# Patient Record
Sex: Female | Born: 1965 | Race: White | Hispanic: No | Marital: Married | Smoking: Never smoker
Health system: Southern US, Community
[De-identification: ages and names within clinical notes are randomized; demographics above are authoritative.]

## PROBLEM LIST (undated history)

## (undated) DIAGNOSIS — K219 Gastro-esophageal reflux disease without esophagitis: Secondary | ICD-10-CM

## (undated) DIAGNOSIS — Z87442 Personal history of urinary calculi: Secondary | ICD-10-CM

## (undated) DIAGNOSIS — Z9221 Personal history of antineoplastic chemotherapy: Secondary | ICD-10-CM

## (undated) DIAGNOSIS — Z923 Personal history of irradiation: Secondary | ICD-10-CM

## (undated) DIAGNOSIS — C801 Malignant (primary) neoplasm, unspecified: Secondary | ICD-10-CM

## (undated) HISTORY — PX: INSERTION CENTRAL VENOUS ACCESS DEVICE W/ SUBCUTANEOUS PORT: SUR725

## (undated) HISTORY — DX: Personal history of irradiation: Z92.3

---

## 2012-01-28 DIAGNOSIS — R7309 Other abnormal glucose: Secondary | ICD-10-CM | POA: Insufficient documentation

## 2012-01-30 DIAGNOSIS — Z0000 Encounter for general adult medical examination without abnormal findings: Secondary | ICD-10-CM | POA: Insufficient documentation

## 2012-01-30 DIAGNOSIS — E669 Obesity, unspecified: Secondary | ICD-10-CM | POA: Insufficient documentation

## 2016-01-23 DIAGNOSIS — Z9221 Personal history of antineoplastic chemotherapy: Secondary | ICD-10-CM

## 2016-01-23 HISTORY — DX: Personal history of antineoplastic chemotherapy: Z92.21

## 2016-03-22 DIAGNOSIS — C50919 Malignant neoplasm of unspecified site of unspecified female breast: Secondary | ICD-10-CM

## 2016-03-22 DIAGNOSIS — C801 Malignant (primary) neoplasm, unspecified: Secondary | ICD-10-CM

## 2016-03-22 HISTORY — DX: Malignant (primary) neoplasm, unspecified: C80.1

## 2016-03-22 HISTORY — DX: Malignant neoplasm of unspecified site of unspecified female breast: C50.919

## 2016-03-22 HISTORY — PX: BREAST BIOPSY: SHX20

## 2016-04-27 DIAGNOSIS — Z170 Estrogen receptor positive status [ER+]: Principal | ICD-10-CM

## 2016-04-27 DIAGNOSIS — C50211 Malignant neoplasm of upper-inner quadrant of right female breast: Secondary | ICD-10-CM | POA: Insufficient documentation

## 2016-05-31 DIAGNOSIS — Z95828 Presence of other vascular implants and grafts: Secondary | ICD-10-CM | POA: Insufficient documentation

## 2016-09-13 ENCOUNTER — Other Ambulatory Visit: Payer: BC Managed Care – PPO

## 2016-09-14 ENCOUNTER — Ambulatory Visit: Payer: BC Managed Care – PPO | Admitting: Hematology & Oncology

## 2016-09-18 ENCOUNTER — Ambulatory Visit (HOSPITAL_BASED_OUTPATIENT_CLINIC_OR_DEPARTMENT_OTHER): Payer: BC Managed Care – PPO

## 2016-09-18 ENCOUNTER — Ambulatory Visit (HOSPITAL_BASED_OUTPATIENT_CLINIC_OR_DEPARTMENT_OTHER): Payer: BC Managed Care – PPO | Admitting: Hematology & Oncology

## 2016-09-18 VITALS — BP 130/82 | HR 102 | Temp 98.7°F | Resp 17 | Wt 160.0 lb

## 2016-09-18 DIAGNOSIS — C50211 Malignant neoplasm of upper-inner quadrant of right female breast: Secondary | ICD-10-CM

## 2016-09-18 DIAGNOSIS — R911 Solitary pulmonary nodule: Secondary | ICD-10-CM | POA: Diagnosis not present

## 2016-09-18 DIAGNOSIS — Z452 Encounter for adjustment and management of vascular access device: Secondary | ICD-10-CM

## 2016-09-18 DIAGNOSIS — Z95828 Presence of other vascular implants and grafts: Secondary | ICD-10-CM

## 2016-09-18 DIAGNOSIS — Z808 Family history of malignant neoplasm of other organs or systems: Secondary | ICD-10-CM | POA: Diagnosis not present

## 2016-09-18 DIAGNOSIS — Z170 Estrogen receptor positive status [ER+]: Principal | ICD-10-CM

## 2016-09-18 DIAGNOSIS — Z803 Family history of malignant neoplasm of breast: Secondary | ICD-10-CM

## 2016-09-18 DIAGNOSIS — N951 Menopausal and female climacteric states: Secondary | ICD-10-CM

## 2016-09-18 DIAGNOSIS — F329 Major depressive disorder, single episode, unspecified: Secondary | ICD-10-CM | POA: Insufficient documentation

## 2016-09-18 DIAGNOSIS — C50911 Malignant neoplasm of unspecified site of right female breast: Secondary | ICD-10-CM | POA: Diagnosis not present

## 2016-09-18 DIAGNOSIS — F32A Depression, unspecified: Secondary | ICD-10-CM | POA: Insufficient documentation

## 2016-09-18 LAB — CBC WITH DIFFERENTIAL (CANCER CENTER ONLY)
BASO#: 0.1 10*3/uL (ref 0.0–0.2)
BASO%: 0.8 % (ref 0.0–2.0)
EOS ABS: 0.1 10*3/uL (ref 0.0–0.5)
EOS%: 1.5 % (ref 0.0–7.0)
HCT: 31.8 % — ABNORMAL LOW (ref 34.8–46.6)
HEMOGLOBIN: 9.9 g/dL — AB (ref 11.6–15.9)
LYMPH#: 1.1 10*3/uL (ref 0.9–3.3)
LYMPH%: 17.1 % (ref 14.0–48.0)
MCH: 28.6 pg (ref 26.0–34.0)
MCHC: 31.1 g/dL — ABNORMAL LOW (ref 32.0–36.0)
MCV: 92 fL (ref 81–101)
MONO#: 0.6 10*3/uL (ref 0.1–0.9)
MONO%: 9.2 % (ref 0.0–13.0)
NEUT%: 71.4 % (ref 39.6–80.0)
NEUTROS ABS: 4.7 10*3/uL (ref 1.5–6.5)
Platelets: 371 10*3/uL (ref 145–400)
RBC: 3.46 10*6/uL — AB (ref 3.70–5.32)
RDW: 15.7 % (ref 11.1–15.7)
WBC: 6.6 10*3/uL (ref 3.9–10.0)

## 2016-09-18 LAB — CMP (CANCER CENTER ONLY)
ALK PHOS: 52 U/L (ref 26–84)
ALT: 34 U/L (ref 10–47)
AST: 45 U/L — ABNORMAL HIGH (ref 11–38)
Albumin: 3.5 g/dL (ref 3.3–5.5)
BUN, Bld: 9 mg/dL (ref 7–22)
CALCIUM: 9.5 mg/dL (ref 8.0–10.3)
CO2: 26 mEq/L (ref 18–33)
Chloride: 106 mEq/L (ref 98–108)
Creat: 0.8 mg/dl (ref 0.6–1.2)
Glucose, Bld: 106 mg/dL (ref 73–118)
POTASSIUM: 4.3 meq/L (ref 3.3–4.7)
SODIUM: 137 meq/L (ref 128–145)
TOTAL PROTEIN: 6.7 g/dL (ref 6.4–8.1)
Total Bilirubin: 0.5 mg/dl (ref 0.20–1.60)

## 2016-09-18 LAB — LACTATE DEHYDROGENASE: LDH: 321 U/L — AB (ref 125–245)

## 2016-09-18 MED ORDER — ALTEPLASE 2 MG IJ SOLR
2.0000 mg | Freq: Once | INTRAMUSCULAR | Status: AC | PRN
  Administered 2016-09-18: 2 mg
  Filled 2016-09-18: qty 2

## 2016-09-18 MED ORDER — SODIUM CHLORIDE 0.9% FLUSH
10.0000 mL | INTRAVENOUS | Status: DC | PRN
  Administered 2016-09-18: 10 mL via INTRAVENOUS
  Filled 2016-09-18: qty 10

## 2016-09-18 MED ORDER — HEPARIN SOD (PORK) LOCK FLUSH 100 UNIT/ML IV SOLN
500.0000 [IU] | Freq: Once | INTRAVENOUS | Status: DC
  Filled 2016-09-18: qty 5

## 2016-09-18 MED ORDER — ALTEPLASE 2 MG IJ SOLR
INTRAMUSCULAR | Status: AC
  Filled 2016-09-18: qty 2

## 2016-09-18 MED ORDER — SODIUM CHLORIDE 0.9% FLUSH
10.0000 mL | INTRAVENOUS | Status: DC | PRN
  Filled 2016-09-18: qty 10

## 2016-09-18 MED ORDER — HEPARIN SOD (PORK) LOCK FLUSH 100 UNIT/ML IV SOLN
500.0000 [IU] | Freq: Once | INTRAVENOUS | Status: AC
  Administered 2016-09-18: 500 [IU] via INTRAVENOUS
  Filled 2016-09-18: qty 5

## 2016-09-18 MED ORDER — STERILE WATER FOR INJECTION IJ SOLN
INTRAMUSCULAR | Status: AC
  Filled 2016-09-18: qty 10

## 2016-09-18 NOTE — Progress Notes (Signed)
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 New Patient (Initial Visit)  : I just moved here.  HPI: Christina Joseph is a very charming 51 year old white female. She is perimenopausal. She presented to [place] back in April with an a larger mass in the right axilla. It was somewhat tender area and she had this evaluated. She had a sonogram done which showed a 5.4 cm mass.  A mammogram was done which showed a spiculated right breast mass.  She ultimately had a biopsy. This was of the right axillary lymph node. This showed an invasive ductal carcinoma consistent with breast primary. The tumor was ER positive/PR positive/HER-2 negative.  She underwent staging studies. 2 no surprise, she had a incredibly thorough evaluation by Dr. Joesph July. She underwent a CT scan. This showed a indeterminate 5 mm left lower lobe lung nodule. A bone scan was done which was negative. She had an MRI of the breast which confirmed a 3.7 x 2.7 cm mass in the medial right breast.  She had a Port-A-Cath placed. She was given neoadjuvant chemotherapy with AC-T.  she had 4 cycles of Adria/Cytoxan followed by 9 cycles of Taxol. She stopped the Taxol because of cutaneous toxicity.  Because of the fact that her husband had a drive a long distance to work, it was felt that it would be a lot easier if should they moved closer to his job. As such, she actually moved with her family just down the street from our cancer clinic.  She was kindly referred to [place] for continuation of her breast cancer therapy. She said that she had a very nice response to treatment. The axillary mass really decreased in size.  She had seen a Psychologist, sport and exercise. This was in [place]. She needs to see a surgeon closer to home. She had planned on a mastectomy.  She will clearly need radiation therapy.  She looks great. She feels that she  is postmenopausal. She is not had a monthly cycle since starting treatment.  She is having some hot flashes.  She does not smoke. 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  There is no history of diabetes. She has no high blood pressure.  She has one child who is 98 years old.  A maternal grandmother had breast cancer. Her mother passed away from [place] 15 years ago.  Overall, her performance status is ECOG 0.   No past medical history on file.:  No past surgical history on file.:   Current Outpatient Prescriptions:  .  dexamethasone (DECADRON) 4 MG tablet, Take 59m (2tabs) orally x 1 dose on chemo day 2. Take 855m(2tabs) orally twice daily on chemo days 3-4. Take with food., Disp: , Rfl:  .  HYDROcodone-acetaminophen (NORCO/VICODIN) 5-325 MG tablet, Take by mouth., Disp: , Rfl:  .  lidocaine-prilocaine (EMLA) cream, Apply topically to the port site 30-45 minutes before each port a cath access.  Cover with Press-N-Seal.  DO NOT rub into the skin., Disp: , Rfl:  .  Misc. Devices MISC, Cranial prosthesis, Disp: , Rfl:  .  ondansetron (ZOFRAN) 8 MG tablet, Take by mouth., Disp: , Rfl:  .  valACYclovir (VALTREX) 1000 MG tablet, Take by mouth., Disp: , Rfl:  .  CALCIUM PO, Take by mouth., Disp: , Rfl:  .  Cholecalciferol (VITAMIN D) 2000 units tablet, Take by mouth., Disp: , Rfl:  .  CYCLOPHOSPHAMIDE IV, Inject into the vein., Disp: , Rfl:  .  DOXOrubicin (ADRIAMYCIN) 10 MG SOLR, Inject into the vein., Disp: , Rfl:  .  paclitaxel (TAXOL) 30 MG/5ML injection, Inject into the vein., Disp: , Rfl:  .  pegfilgrastim (NEULASTA) 6 MG/0.6ML injection, Inject into the skin., Disp: , Rfl:  No current facility-administered medications for this visit.   Facility-Administered Medications Ordered in Other Visits:  .  heparin lock flush 100 unit/mL, 500 Units, Intravenous, Once, Ennever, Peter R, MD .  heparin lock flush 100 unit/mL, 500 Units, Intravenous, Once, [place], Sarah M, NP .   sodium chloride flush (NS) 0.9 % injection 10 mL, 10 mL, Intravenous, PRN, [name], MD .  sodium chloride flush (NS) 0.9 % injection 10 mL, 10 mL, Intravenous, PRN, [place], Holli Humbles, NP:  :  Not on File:  No family history on file.:  Social History   Social History  . Marital status: Married    Spouse name: N/A  . Number of children: N/A  . Years of education: N/A   Occupational History  . Not on file.   Social History Main Topics  . Smoking status: Not on file  . Smokeless tobacco: Not on file  . Alcohol use Not on file  . Drug use: Unknown  . Sexual activity: Not on file   Other Topics Concern  . Not on file   Social History Narrative  . No narrative on file  :  Pertinent items are noted in HPI.  Exam:Well-developed and well-nourished white female in no obvious distress. Vital signs show temperature of 98.7. Pulse 102. Blood pressure 130/82. Weight is 160 pounds. Head and neck exam shows no ocular or oral lesions. She has alopecia. There is no mucositis. She has no adenopathy in the neck. Thyroid is nonpalpable. Lungs are clear bilaterally. She has good air movement bilaterally. Cardiac exam regular rate and rhythm with no murmurs, rubs or bruits. Abdomen is soft. She has good bowel sounds. There is no fluid wave. There is no palpable liver or spleen tip. Breast exam was deferred. Her axillary show some fullness in the right axilla. There may be a residual palpable mass in the right axilla. It measures about maybe 1 cm.  Back exam shows no tenderness over the spine, ribs or hips. Extremities shows no clubbing, cyanosis or edema. Neurological exam shows no focal neurological deficits. Skin exam shows no rashes, ecchymoses or petechia.   Recent Labs  09/18/16 1058  WBC 6.6  HGB 9.9*  HCT 31.8*  PLT 371    Recent Labs  09/18/16 1058  NA 137  K 4.3  CL 106  CO2 26  GLUCOSE 106  BUN 9  CREATININE 0.8  CALCIUM 9.5    Blood smear review:   None  Pathology: See above     Assessment and Plan: Christina Joseph is a very charming 51 year old menopausal white female. She has a locally advanced ductal carcinoma of the right breast. She presented with a right axillary mass.  She really looks quite good. She has been through very aggressive chemotherapy. It sounds like she had dose dense chemotherapy with the A/C being given every 2 weeks. She's had a nice response by her physical exam.  We need to get her to surgery now. We'll make the referral to Dr. Dalbert Batman of general surgery. I think he would be a very good surgeon to help with her mastectomy.  The pathology at the time of her mastectomy will be  critical. I would have to believe that she has some residual disease that is active.  She clearly will need radiation therapy. We will make the referral to Dr. Sondra Come of radiation oncology.  As far as adjuvant therapy, she will be a great candidate for antiestrogen therapy. We need to make sure that she is menopausal. If she is not, she will clearly need chemical ovarian suppression.  I don't think that this left lower lobe lung nodule is malignant for her breast cancer. However, we have to follow up with this. I will see about another CT scan for follow-up.  I also would like to get an echocardiogram on her.  I also believe that the use of Prolia in the adjuvant setting would be helpful.  I spent about 60 minutes with her. She is very nice. She has been through quite a lot. She has had a great response to treatment. We just have to "finish the job" and do local therapy now with mastectomy and radiation.  I answered all of her questions. She is very thankful that we could see her because she lives so close to our office.  I would like to see her back in another 4 or 5 weeks. By then, she would've had her mastectomy. Her blood counts and immune system should be adequate for mastectomy.

## 2016-09-18 NOTE — Patient Instructions (Signed)
Ferumoxytol injection What is this medicine? FERUMOXYTOL is an iron complex. Iron is used to make healthy red blood cells, which carry oxygen and nutrients throughout the body. This medicine is used to treat iron deficiency anemia in people with chronic kidney disease. This medicine may be used for other purposes; ask your health care provider or pharmacist if you have questions. COMMON BRAND NAME(S): Feraheme What should I tell my health care provider before I take this medicine? They need to know if you have any of these conditions: -anemia not caused by low iron levels -high levels of iron in the blood -magnetic resonance imaging (MRI) test scheduled -an unusual or allergic reaction to iron, other medicines, foods, dyes, or preservatives -pregnant or trying to get pregnant -breast-feeding How should I use this medicine? This medicine is for injection into a vein. It is given by a health care professional in a hospital or clinic setting. Talk to your pediatrician regarding the use of this medicine in children. Special care may be needed. Overdosage: If you think you have taken too much of this medicine contact a poison control center or emergency room at once. NOTE: This medicine is only for you. Do not share this medicine with others. What if I miss a dose? It is important not to miss your dose. Call your doctor or health care professional if you are unable to keep an appointment. What may interact with this medicine? This medicine may interact with the following medications: -other iron products This list may not describe all possible interactions. Give your health care provider a list of all the medicines, herbs, non-prescription drugs, or dietary supplements you use. Also tell them if you smoke, drink alcohol, or use illegal drugs. Some items may interact with your medicine. What should I watch for while using this medicine? Visit your doctor or healthcare professional regularly. Tell  your doctor or healthcare professional if your symptoms do not start to get better or if they get worse. You may need blood work done while you are taking this medicine. You may need to follow a special diet. Talk to your doctor. Foods that contain iron include: whole grains/cereals, dried fruits, beans, or peas, leafy green vegetables, and organ meats (liver, kidney). What side effects may I notice from receiving this medicine? Side effects that you should report to your doctor or health care professional as soon as possible: -allergic reactions like skin rash, itching or hives, swelling of the face, lips, or tongue -breathing problems -changes in blood pressure -feeling faint or lightheaded, falls -fever or chills -flushing, sweating, or hot feelings -swelling of the ankles or feet Side effects that usually do not require medical attention (report to your doctor or health care professional if they continue or are bothersome): -diarrhea -headache -nausea, vomiting -stomach pain This list may not describe all possible side effects. Call your doctor for medical advice about side effects. You may report side effects to FDA at 1-800-FDA-1088. Where should I keep my medicine? This drug is given in a hospital or clinic and will not be stored at home. NOTE: This sheet is a summary. It may not cover all possible information. If you have questions about this medicine, talk to your doctor, pharmacist, or health care provider.  2018 Elsevier/Gold Standard (2015-02-10 12:41:49) Implanted [facility] Guide An implanted port is a type of central line that is placed under the skin. Central lines are used to provide IV access when treatment or nutrition needs to be given through  a person's veins. Implanted ports are used for long-term IV access. An implanted port may be placed because:  You need IV medicine that would be irritating to the small veins in your hands or arms.  You need long-term IV medicines,  such as antibiotics.  You need IV nutrition for a long period.  You need frequent blood draws for lab tests.  You need dialysis.  Implanted ports are usually placed in the chest area, but they can also be placed in the upper arm, the abdomen, or the leg. An implanted port has two main parts:  Reservoir. The reservoir is round and will appear as a small, raised area under your skin. The reservoir is the part where a needle is inserted to give medicines or draw blood.  Catheter. The catheter is a thin, flexible tube that extends from the reservoir. The catheter is placed into a large vein. Medicine that is inserted into the reservoir goes into the catheter and then into the vein.  How will I care for my incision site? Do not get the incision site wet. Bathe or shower as directed by your health care provider. How is my port accessed? Special steps must be taken to access the port:  Before the port is accessed, a numbing cream can be placed on the skin. This helps numb the skin over the port site.  Your health care provider uses a sterile technique to access the port. ? Your health care provider must put on a mask and sterile gloves. ? The skin over your port is cleaned carefully with an antiseptic and allowed to dry. ? The port is gently pinched between sterile gloves, and a needle is inserted into the port.  Only "non-coring" port needles should be used to access the port. Once the port is accessed, a blood return should be checked. This helps ensure that the port is in the vein and is not clogged.  If your port needs to remain accessed for a constant infusion, a clear (transparent) bandage will be placed over the needle site. The bandage and needle will need to be changed every week, or as directed by your health care provider.  Keep the bandage covering the needle clean and dry. Do not get it wet. Follow your health care provider's instructions on how to take a shower or bath while the  port is accessed.  If your port does not need to stay accessed, no bandage is needed over the port.  What is flushing? Flushing helps keep the port from getting clogged. Follow your health care provider's instructions on how and when to flush the port. Ports are usually flushed with saline solution or a medicine called heparin. The need for flushing will depend on how the port is used.  If the port is used for intermittent medicines or blood draws, the port will need to be flushed: ? After medicines have been given. ? After blood has been drawn. ? As part of routine maintenance.  If a constant infusion is running, the port may not need to be flushed.  How long will my port stay implanted? The port can stay in for as long as your health care provider thinks it is needed. When it is time for the port to come out, surgery will be done to remove it. The procedure is similar to the one performed when the port was put in. When should I seek immediate medical care? When you have an implanted port, you should seek immediate  medical care if:  You notice a bad smell coming from the incision site.  You have swelling, redness, or drainage at the incision site.  You have more swelling or pain at the port site or the surrounding area.  You have a fever that is not controlled with medicine.  This information is not intended to replace advice given to you by your health care provider. Make sure you discuss any questions you have with your health care provider. Document Released: 01/08/2005 Document Revised: 06/16/2015 Document Reviewed: 09/15/2012 Elsevier Interactive Patient Education  2017 [employer].

## 2016-09-19 ENCOUNTER — Encounter (HOSPITAL_BASED_OUTPATIENT_CLINIC_OR_DEPARTMENT_OTHER): Payer: Self-pay

## 2016-09-19 ENCOUNTER — Ambulatory Visit (HOSPITAL_BASED_OUTPATIENT_CLINIC_OR_DEPARTMENT_OTHER)
Admission: RE | Admit: 2016-09-19 | Discharge: 2016-09-19 | Disposition: A | Discharge status: Home / Self Care | Payer: BC Managed Care – PPO | Source: Ambulatory Visit | Attending: Hematology & Oncology | Admitting: Hematology & Oncology

## 2016-09-19 ENCOUNTER — Encounter: Payer: Self-pay | Admitting: Radiation Oncology

## 2016-09-19 ENCOUNTER — Other Ambulatory Visit: Payer: Self-pay | Admitting: Hematology & Oncology

## 2016-09-19 ENCOUNTER — Encounter: Payer: Self-pay | Admitting: *Deleted

## 2016-09-19 DIAGNOSIS — I2510 Atherosclerotic heart disease of native coronary artery without angina pectoris: Secondary | ICD-10-CM | POA: Insufficient documentation

## 2016-09-19 DIAGNOSIS — C50211 Malignant neoplasm of upper-inner quadrant of right female breast: Secondary | ICD-10-CM

## 2016-09-19 DIAGNOSIS — M899 Disorder of bone, unspecified: Secondary | ICD-10-CM | POA: Insufficient documentation

## 2016-09-19 DIAGNOSIS — Z170 Estrogen receptor positive status [ER+]: Secondary | ICD-10-CM

## 2016-09-19 DIAGNOSIS — R590 Localized enlarged lymph nodes: Secondary | ICD-10-CM | POA: Insufficient documentation

## 2016-09-19 DIAGNOSIS — D7389 Other diseases of spleen: Secondary | ICD-10-CM | POA: Insufficient documentation

## 2016-09-19 DIAGNOSIS — R911 Solitary pulmonary nodule: Secondary | ICD-10-CM | POA: Diagnosis not present

## 2016-09-19 HISTORY — DX: Malignant (primary) neoplasm, unspecified: C80.1

## 2016-09-19 LAB — CANCER ANTIGEN 27.29: CAN 27.29: 26.7 U/mL (ref 0.0–38.6)

## 2016-09-19 LAB — FOLLICLE STIMULATING HORMONE: FSH: 64.7 m[IU]/mL

## 2016-09-19 LAB — LUTEINIZING HORMONE: LH: 32.6 m[IU]/mL

## 2016-09-19 MED ORDER — IOPAMIDOL (ISOVUE-300) INJECTION 61%
100.0000 mL | Freq: Once | INTRAVENOUS | Status: AC | PRN
  Administered 2016-09-19: 80 mL via INTRAVENOUS

## 2016-09-19 NOTE — Progress Notes (Signed)
  Echocardiogram 2D Echocardiogram has been performed.  Rashmi Tallent L Androw 09/19/2016, 1:43 PM

## 2016-09-20 LAB — ESTRADIOL, ULTRA SENS: Estradiol, Sensitive: <2.5 pg/mL

## 2016-09-21 ENCOUNTER — Telehealth: Payer: Self-pay | Admitting: *Deleted

## 2016-09-21 NOTE — Telephone Encounter (Signed)
Called the patient to let her know that the lung nodule is still there but has not grown!!! I do not think it is cancerous!!!

## 2016-09-21 NOTE — Progress Notes (Signed)
Location of Breast Cancer: infiltrating ductal carcinoma of the right breast   Histology per Pathology Report:   04/09/16 Breast, right (medial), needle core biopsy:             Invasive ductal carcinoma, not otherwise specified.             See comment.   Diagnosis Comment Microscopic sections revealed evidence of a ductal carcinoma. Many of the tumor cells were dislodged or disassociated from the adjacent stroma. Some features of a carcinoma with papillary features were present. The pancytokeratin stain showed changes consistent with an invasive ductal carcinoma. The tumor cells were also strongly positive for E-cadherin. Immunoperoxidase stains for myosin and calponin failed to show any appreciable staining around tumor islands. Material will be submitted for the breast prognostic panel.      Receptor Status: ER(95%), PR (95%), Her2-neu (borderline), Ki-()  Did patient present with symptoms (if so, please note symptoms) or was this found on screening mammography?: enlarging mass in her right axillary area over the course of the last 2 months  Past/Anticipated interventions by surgeon, if any: breast biopsy 04/09/16  Past/Anticipated interventions by medical oncology, if any:  She was given neoadjuvant chemotherapy with AC-T.  she had 4 cycles of Adria/Cytoxan followed by 9 cycles of Taxol. She stopped the Taxol because of cutaneous toxicity.  She finished 08/30/16.  She had chemo at [facility] in [place].  Lymphedema issues, if any:  no   Pain issues, if any: no  SAFETY ISSUES:  Prior radiation? no  Pacemaker/ICD? no  Possible current pregnancy?no  Is the patient on methotrexate? no  Current Complaints / other details:  Patient has a family history of breath cancer in her paternal grandmother.  She recently moved from [facility].    BP 123/78 (BP Location: Left Arm, Patient Position: Sitting)   Pulse (!) 108   Temp 98.9 F (37.2 C) (Oral)   Ht _0  (1.626 m)   Wt 160  lb 3.2 oz (72.7 kg)   SpO2 98%   BMI 27.50 kg/m    Wt Readings from Last 3 Encounters:  09/26/16 160 lb 3.2 oz (72.7 kg)  09/18/16 160 lb (72.6 kg)      Hess, Craige Cotta, RN 09/21/2016,7:53 AM

## 2016-09-21 NOTE — Telephone Encounter (Signed)
-----   Message from [name], MD sent at 09/19/2016  4:59 PM EDT ----- Call - the lung nodule is still there but has not grown!!!  I do not think it is cancerous!!! Christina Joseph

## 2016-09-26 ENCOUNTER — Ambulatory Visit
Admission: RE | Admit: 2016-09-26 | Discharge: 2016-09-26 | Disposition: A | Discharge status: Home / Self Care | Payer: BC Managed Care – PPO | Source: Ambulatory Visit | Attending: Radiation Oncology | Admitting: Radiation Oncology

## 2016-09-26 ENCOUNTER — Encounter: Payer: Self-pay | Admitting: Radiation Oncology

## 2016-09-26 VITALS — BP 123/78 | HR 108 | Temp 98.9°F | Ht 64.0 in | Wt 160.2 lb

## 2016-09-26 DIAGNOSIS — C50911 Malignant neoplasm of unspecified site of right female breast: Secondary | ICD-10-CM | POA: Insufficient documentation

## 2016-09-26 DIAGNOSIS — C50211 Malignant neoplasm of upper-inner quadrant of right female breast: Secondary | ICD-10-CM

## 2016-09-26 DIAGNOSIS — Z170 Estrogen receptor positive status [ER+]: Principal | ICD-10-CM

## 2016-09-26 DIAGNOSIS — Z803 Family history of malignant neoplasm of breast: Secondary | ICD-10-CM | POA: Diagnosis not present

## 2016-09-26 DIAGNOSIS — Z510 Encounter for antineoplastic radiation therapy: Secondary | ICD-10-CM | POA: Insufficient documentation

## 2016-09-26 NOTE — Progress Notes (Signed)
Please see the Nurse Progress Note in the MD Initial Consult Encounter for this patient. 

## 2016-09-26 NOTE — Progress Notes (Signed)
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    HISTORY OF PRESENT ILLNESS::Christina Joseph is a 51 y.o. female who is kindly referred to our clinic by Dr. Marin Olp. The patient was seen by Dr. Marin Olp on 09/18/16. She previously received care in [facility] but recently moved to this area and transferred her care.  The patient originally presented to the [facility] in March 2018 with a large mass in the right axilla. Sonogram was done which showed a 5.4 cm mass. Mammogram showed a spiculated right breast mass. A biopsy of the right axillary lymph node was performed on 04/09/2016 which revealed an invasive ductal carcinoma consistent with breast primary. The tumor was ER 95%, PR 95%, HER-2(-).  She underwent staging studies. CT Chest/Abdomen scan showed an indeterminate 5 mm left lower lobe lung nodule. A bone scan was done which was negative. She had an MRI of the breast on 04/30/2016 which confirmed a 3.7 x 2.7 cm mass in the medial right breast with associated architectural distortion and a large heterogeneous right axillary mass with thick irregular rim enhancement measuring 6.9 x 6.5 cm.   She was given neoadjuvant chemotherapy with AC-T, with Dr. Joesph July at [facility] in [place]. She had 4 cycles of Adria/Cytoxan followed by 9 cycles of Taxol. She stopped the Taxol because of cutaneous toxicity.  Recent CT chest on 09/19/2016 shows a solitary bulky enlarged right axillary lymph node compatible with nodal metastasis, and no additional thoracic adenopathy. There is a solitary tiny solid 3 mm subpleural left lower lobe pulmonary  nodule, for which follow-up chest CT is advised in 3-6 months. There is a partially visualized mixed lytic and sclerotic lesion in the proximal left humeral metaphysis, incompletely evaluated on this scan. Findings may represent a bone infarct, although a chondoid lesion is not excluded. Appearance is not typical for breast cancer metastasis. Also seen is a subcentimeter low-attenuation central splenic lesion, too small to characterize. MRI abdomen with and without IV contrast is recommended in 3-6 months.  She has not yet met with a Education officer, environmental. She has been referred to meet with Dr. Dalbert Batman by Dr. Marin Olp. She is not interested in having a breast implant or reconstruction.   She did see a radiation oncologist while in [facility] but only saw her once and the physician was retiring the following week.   Family history of breast cancer includes a paternal grandmother who passed away in her 46s. There is no family history of ovarian cancer. Her mother was diagnosed with melanoma, died in her 39's from this problem.   PREVIOUS RADIATION THERAPY: No  PAST MEDICAL HISTORY:  has a past medical history of Cancer ([place]) (03/2016).    PAST SURGICAL HISTORY: Past Surgical History:  Procedure Laterality Date  . BREAST BIOPSY Right 03/2016  . INSERTION CENTRAL VENOUS ACCESS DEVICE W/ SUBCUTANEOUS PORT     done at [facility]    FAMILY HISTORY: family history includes Breast cancer in her paternal grandmother; Melanoma in her mother.  SOCIAL HISTORY:  reports that she has never smoked. She has never used smokeless tobacco. She reports that she does not drink alcohol or  use drugs.  ALLERGIES: Patient has no known allergies.  MEDICATIONS:  Current Outpatient Prescriptions  Medication Sig Dispense Refill  . Misc. Devices MISC Cranial prosthesis    . lidocaine-prilocaine (EMLA) cream Apply topically to the port site 30-45 minutes before each port a cath access.  Cover with Press-N-Seal.  DO NOT rub  into the skin.    Marland Kitchen ondansetron (ZOFRAN) 8 MG tablet Take by mouth.    . valACYclovir (VALTREX) 1000 MG tablet Take by mouth.     No current facility-administered medications for this encounter.     REVIEW OF SYSTEMS:  On review of systems, the patient reports that she is doing well overall. She denies any chest pain, shortness of breath, cough, fevers, chills, night sweats, unintended weight changes. She denies any bowel or bladder disturbances, and denies abdominal pain, nausea or vomiting. She denies any new musculoskeletal or joint aches or pains, new skin lesions or concerns. She denies lymphdema issues. She denies any pain. She denies breast pain or back pain. She denies headaches or blurry vision. She denies any nipple discharge or bleeding. She denies any major issues with chemotherapy. She denies any issues raising her arms above her head. She denies problems with swelling of the ankles or feet. She does note blisters on her feet after sitting on a woven rug. REVIEW OF SYSTEMS: A 10+ POINT REVIEW OF SYSTEMS WAS OBTAINED including neurology, dermatology, psychiatry, cardiac, respiratory, lymph, extremities, GI, GU, musculoskeletal, constitutional, reproductive, HEENT. All pertinent positives are noted in the HPI. All others are negative.   PHYSICAL EXAM:  height is '5\' 4"'$  (1.626 m) and weight is 160 lb 3.2 oz (72.7 kg). Her oral temperature is 98.9 F (37.2 C). Her blood pressure is 123/78 and her pulse is 108 (abnormal). Her oxygen saturation is 98%.   General: Alert and oriented, in no acute distress HEENT: Head is normocephalic. Extraocular movements are intact. Oropharynx is clear. Neck: Neck is supple, no palpable cervical or supraclavicular lymphadenopathy. Heart: Regular in rate and rhythm with no murmurs, rubs, or gallops. Chest: Clear to auscultation bilaterally, with no rhonchi, wheezes, or rales. Abdomen: Soft, nontender, nondistended, with no rigidity or guarding. Extremities: No  cyanosis or edema. Lymphatics: see Neck Exam Skin: No concerning lesions. Musculoskeletal: symmetric strength and muscle tone throughout. Neurologic: Cranial nerves II through XII are grossly intact. No obvious focalities. Speech is fluent. Coordination is intact. Psychiatric: Judgment and insight are intact. Affect is appropriate. Breast: Left breast no palpable mass or nipple discharge. Right breast no palpable mass or nipple discharge. In the right axilla, there is a barely palpable approximately 1.5 cm lymph node deep in the axillary region. No nipple discharge or bleeding.   ECOG = 1  0 - Asymptomatic (Fully active, able to carry on all predisease activities without restriction)  1 - Symptomatic but completely ambulatory (Restricted in physically strenuous activity but ambulatory and able to carry out work of a light or sedentary nature. For example, light housework, office work)  2 - Symptomatic, <50% in bed during the day (Ambulatory and capable of all self care but unable to carry out any work activities. Up and about more than 50% of waking hours)  3 - Symptomatic, >50% in bed, but not bedbound (Capable of only limited self-care, confined to bed or chair 50% or more of waking hours)  4 - Bedbound (Completely disabled. Cannot carry on any self-care. Totally confined to bed or chair)  5 - Death   Lolita Rieger, Daniel Nones  RH, Tormey DC, et al. [phone]). "Toxicity and response criteria of the [employer]". [place] Oncol. 5 (6): 649-55  LABORATORY DATA:  Lab Results  Component Value Date   WBC 6.6 09/18/2016   HGB 9.9 (L) 09/18/2016   HCT 31.8 (L) 09/18/2016   MCV 92 09/18/2016   PLT 371 09/18/2016   NEUTROABS 4.7 09/18/2016   Lab Results  Component Value Date   NA 137 09/18/2016   K 4.3 09/18/2016   CL 106 09/18/2016   CO2 26 09/18/2016   GLUCOSE 106 09/18/2016   CREATININE 0.8 09/18/2016   CALCIUM 9.5 09/18/2016      RADIOGRAPHY: Ct Chest W  Contrast  Result Date: 09/19/2016 CLINICAL DATA:  Stage III right breast cancer diagnosed March 2018, presenting for restaging status post neoadjuvant chemotherapy reportedly completed 1 day prior. Reported history of left lower lobe pulmonary nodule on pre-chemotherapy scan at an outside institution in April 2018. EXAM: CT CHEST WITH CONTRAST TECHNIQUE: Multidetector CT imaging of the chest was performed during intravenous contrast administration. CONTRAST:  75m ISOVUE-300 IOPAMIDOL (ISOVUE-300) INJECTION 61% COMPARISON:  None available at the time of this dictation. FINDINGS: Cardiovascular: Normal heart size. No significant pericardial fluid/thickening. Left anterior descending coronary atherosclerosis. Left subclavian MediPort terminates in the middle third of the superior vena cava. Thoracic aorta is normal in course and caliber. Normal caliber pulmonary arteries. No central pulmonary emboli. Mediastinum/Nodes: No discrete thyroid nodules. Unremarkable esophagus. Bulky 4.0 cm right axillary lymph node (series 2/ image 27) with hazy margins. No additional pathologically enlarged axillary, mediastinal or hilar lymph nodes. Lungs/Pleura: No pneumothorax. No pleural effusion. No acute consolidative airspace disease or lung masses. Anterior left lower lobe subpleural solid 3 mm pulmonary nodule (series 3/ image 82). No additional significant pulmonary nodules. Upper abdomen: Hypodense subcentimeter central splenic lesion (series 2/ image 132). Musculoskeletal: Partially visualized mixed lytic and sclerotic lesion in the proximal left humeral metaphysis with wide zone of transition. No additional focal osseous lesions. Mild thoracic spondylosis. IMPRESSION: 1. Solitary bulky enlarged right axillary lymph node compatible with nodal metastasis. No additional thoracic adenopathy. 2. Solitary tiny solid 3 mm subpleural left lower lobe pulmonary nodule, for which follow-up chest CT is advised in 3-6 months. 3.  Partially visualized mixed lytic and sclerotic lesion in the proximal left humeral metaphysis, incompletely evaluated on this scan. Findings may represent a bone infarct, although a chondroid lesion is not excluded. Appearance is not typical for breast cancer bone metastasis. Recommend dedicated left humerus radiographs and/or PET-CT for further evaluation. 4. Subcentimeter low-attenuation central splenic lesion, too small to characterize. Recommend attention on follow-up MRI abdomen without and with IV contrast in 3-6 months. 5. One vessel coronary atherosclerosis. Electronically Signed   By: JIlona SorrelM.D.   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  The patient may be a potential candidate for breast conservation given her excellent response to neoadjuvant treatment. She, however,  appears to be leaning toward mastectomy at this time. She will likely require an axillary dissection based on the number and size of  axillary lymph node involvement prior to neoadjuvant chemotherapy,  but will let Dr. IDalbert Batmanmake that determination. We discussed issues of breast reconstruction after mastectomy but at this point the patient does not wish to consider this as part of her treatment and does not wish to have  a referral to  plastic surgery.   She has already completed neoadjuvant chemotherapy. She will proceed with surgery followed by radiation therapy. We discussed this plan, as well as the risks, benefits, and side effects of radiotherapy. RT should decrease risk of locoregional recurrence by about 2/3, along with a modest overall survival benefit. We discussed that radiation would take approximately 6 1/2 weeks to complete. I would give the patient a few weeks to heal following surgery before starting treatment planning. We spoke about acute effects including skin irritation and  fatigue as well as much less common late effects including lung irritation. We spoke about the latest technology that is used to minimize the risk of late effects for breast cancer patients undergoing radiotherapy.     PLAN: The patient will meet with Dr. Fanny Skates soon and determine if she will proceed with breast conservation surgery or mastectomy. She will be referred back to our clinic after surgery for post-op radiation therapy.      ------------------------------------------------  Blair Promise, PhD, MD  This document serves as a record of services personally performed by Gery Pray, MD. It was created on his behalf by Arlyce Harman, a trained medical scribe. The creation of this record is based on the scribe's personal observations and the provider's statements to them. This document has been checked and approved by the attending provider.

## 2016-09-27 ENCOUNTER — Telehealth: Payer: Self-pay | Admitting: *Deleted

## 2016-09-27 NOTE — Telephone Encounter (Signed)
Patient to see Dr Dalbert Batman tomorrow at 2:15.  Patient aware

## 2016-10-02 ENCOUNTER — Other Ambulatory Visit: Payer: Self-pay | Admitting: General Surgery

## 2016-10-02 DIAGNOSIS — C50211 Malignant neoplasm of upper-inner quadrant of right female breast: Secondary | ICD-10-CM

## 2016-10-04 ENCOUNTER — Other Ambulatory Visit: Payer: Self-pay | Admitting: General Surgery

## 2016-10-04 DIAGNOSIS — Z170 Estrogen receptor positive status [ER+]: Principal | ICD-10-CM

## 2016-10-04 DIAGNOSIS — C50911 Malignant neoplasm of unspecified site of right female breast: Secondary | ICD-10-CM

## 2016-10-08 ENCOUNTER — Other Ambulatory Visit: Payer: Self-pay | Admitting: General Surgery

## 2016-10-08 DIAGNOSIS — Z170 Estrogen receptor positive status [ER+]: Principal | ICD-10-CM

## 2016-10-08 DIAGNOSIS — C50911 Malignant neoplasm of unspecified site of right female breast: Secondary | ICD-10-CM

## 2016-10-11 ENCOUNTER — Ambulatory Visit
Admission: RE | Admit: 2016-10-11 | Discharge: 2016-10-11 | Disposition: A | Discharge status: Home / Self Care | Payer: BC Managed Care – PPO | Source: Ambulatory Visit | Attending: General Surgery | Admitting: General Surgery

## 2016-10-11 DIAGNOSIS — C50211 Malignant neoplasm of upper-inner quadrant of right female breast: Secondary | ICD-10-CM

## 2016-10-11 MED ORDER — GADOBENATE DIMEGLUMINE 529 MG/ML IV SOLN: 15.0000 mL | Freq: Once | INTRAVENOUS | Status: DC | PRN

## 2016-10-11 NOTE — Pre-Procedure Instructions (Signed)
Christina Joseph  10/11/2016      [facility] - [place] - [name] [place] [address] Phone: [phone] Fax: [phone]    Your procedure is scheduled on September 26  Report to [place] at [place].M.  Call this number if you have problems the morning of surgery:  [phone]   Remember:  Do not eat food or drink liquids after midnight. Continue all other medications as directed by your physician except follow these medication instructions before surgery  Please complete your 8oz of Boost Breeze or Water that was given to you at your preadmission appointment by 0430am on the day of your surgery    Take these medicines the morning of surgery with A SIP OF WATER valACYclovir (VALTREX if needed  7 days prior to surgery STOP taking any Aspirin, Aleve, Naproxen, Ibuprofen, Motrin, Advil, Goody's, BC's, all herbal medications, fish oil, and all vitamins    Do not wear jewelry, make-up or nail polish.  Do not wear lotions, powders, or perfumes, or deoderant.  Do not shave 48 hours prior to surgery.  Men may shave face and neck.  Do not bring valuables to the hospital.  [facility] is not responsible for any belongings or valuables.  Contacts, dentures or bridgework may not be worn into surgery.  Leave your suitcase in the car.  After surgery it may be brought to your room.  For patients admitted to the hospital, discharge time will be determined by your treatment team.  Patients discharged the day of surgery will not be allowed to drive home.    Special instructions:   [place]- Preparing For Surgery  Before surgery, you can play an important role. Because skin is not sterile, your skin needs to be as free of germs as possible. You can reduce the number of germs on your skin by washing with CHG (chlorahexidine gluconate) Soap before surgery.  CHG is an  antiseptic cleaner which kills germs and bonds with the skin to continue killing germs even after washing.  Please do not use if you have an allergy to CHG or antibacterial soaps. If your skin becomes reddened/irritated stop using the CHG.  Do not shave (including legs and underarms) for at least 48 hours prior to first CHG shower. It is OK to shave your face.  Please follow these instructions carefully.   1. Shower the NIGHT BEFORE SURGERY and the MORNING OF SURGERY with CHG.   2. If you chose to wash your hair, wash your hair first as usual with your normal shampoo.  3. After you shampoo, rinse your hair and body thoroughly to remove the shampoo.  4. Use CHG as you would any other liquid soap. You can apply CHG directly to the skin and wash gently with a scrungie or a clean washcloth.   5. Apply the CHG Soap to your body ONLY FROM THE NECK DOWN.  Do not use on open wounds or open sores. Avoid contact with your eyes, ears, mouth and genitals (private parts). Wash genitals (private parts) with your normal soap.  6. Wash thoroughly, paying special attention to the area where your surgery will be performed.  7. Thoroughly rinse your body with warm water from the neck down.  8. DO NOT shower/wash with your normal soap after using and rinsing off the CHG Soap.  9. Pat yourself dry  with a CLEAN TOWEL.   10. Wear CLEAN PAJAMAS   11. Place CLEAN SHEETS on your bed the night of your first shower and DO NOT SLEEP WITH PETS.    Day of Surgery: Do not apply any deodorants/lotions. Please wear clean clothes to the hospital/surgery center.     Please read over the following fact sheets that you were given.

## 2016-10-12 ENCOUNTER — Encounter (HOSPITAL_COMMUNITY): Payer: Self-pay

## 2016-10-12 ENCOUNTER — Encounter (HOSPITAL_COMMUNITY)
Admission: RE | Admit: 2016-10-12 | Discharge: 2016-10-12 | Disposition: A | Discharge status: Home / Self Care | Payer: BC Managed Care – PPO | Source: Ambulatory Visit | Attending: General Surgery | Admitting: General Surgery

## 2016-10-12 DIAGNOSIS — Z01812 Encounter for preprocedural laboratory examination: Secondary | ICD-10-CM | POA: Insufficient documentation

## 2016-10-12 DIAGNOSIS — C50911 Malignant neoplasm of unspecified site of right female breast: Secondary | ICD-10-CM | POA: Diagnosis not present

## 2016-10-12 HISTORY — DX: Personal history of urinary calculi: Z87.442

## 2016-10-12 HISTORY — DX: Gastro-esophageal reflux disease without esophagitis: K21.9

## 2016-10-12 LAB — CBC WITH DIFFERENTIAL/PLATELET
BASOS ABS: 0 10*3/uL (ref 0.0–0.1)
Basophils Relative: 1 %
EOS ABS: 0.1 10*3/uL (ref 0.0–0.7)
EOS PCT: 3 %
HCT: 34.3 % — ABNORMAL LOW (ref 36.0–46.0)
Hemoglobin: 10.8 g/dL — ABNORMAL LOW (ref 12.0–15.0)
LYMPHS ABS: 1.4 10*3/uL (ref 0.7–4.0)
Lymphocytes Relative: 28 %
MCH: 27.6 pg (ref 26.0–34.0)
MCHC: 31.5 g/dL (ref 30.0–36.0)
MCV: 87.7 fL (ref 78.0–100.0)
Monocytes Absolute: 0.3 10*3/uL (ref 0.1–1.0)
Monocytes Relative: 6 %
Neutro Abs: 3 10*3/uL (ref 1.7–7.7)
Neutrophils Relative %: 62 %
PLATELETS: 231 10*3/uL (ref 150–400)
RBC: 3.91 MIL/uL (ref 3.87–5.11)
RDW: 15.6 % — ABNORMAL HIGH (ref 11.5–15.5)
WBC: 4.8 10*3/uL (ref 4.0–10.5)

## 2016-10-12 LAB — COMPREHENSIVE METABOLIC PANEL
ALT: 27 U/L (ref 14–54)
ANION GAP: 7 (ref 5–15)
AST: 30 U/L (ref 15–41)
Albumin: 3.9 g/dL (ref 3.5–5.0)
Alkaline Phosphatase: 44 U/L (ref 38–126)
BUN: <5 mg/dL — ABNORMAL LOW (ref 6–20)
CO2: 26 mmol/L (ref 22–32)
Calcium: 9.6 mg/dL (ref 8.9–10.3)
Chloride: 107 mmol/L (ref 101–111)
Creatinine, Ser: 0.66 mg/dL (ref 0.44–1.00)
Glucose, Bld: 106 mg/dL — ABNORMAL HIGH (ref 65–99)
POTASSIUM: 4 mmol/L (ref 3.5–5.1)
SODIUM: 140 mmol/L (ref 135–145)
Total Bilirubin: 0.6 mg/dL (ref 0.3–1.2)
Total Protein: 7.1 g/dL (ref 6.5–8.1)

## 2016-10-12 NOTE — Pre-Procedure Instructions (Signed)
Christina Joseph  10/12/2016      [facility] - [place], [place] - [address] [place] [address] Phone: [phone] Fax: [phone]    Your procedure is scheduled on September 26  Report to [place] at [place].M.  Call this number if you have problems the morning of surgery:  [phone]   Remember:  Do not eat food or drink liquids after midnight. Continue all other medications as directed by your physician except follow these medication instructions before surgery  Please complete your 8oz of Boost Breeze that was given to you at your preadmission appointment by 430am on the day of your surgery    Take these medicines the morning of surgery with A SIP OF WATER valACYclovir (VALTREX if needed  7 days prior to surgery STOP 10/12/16 taking any Aspirin, Aleve, Naproxen, Ibuprofen, Motrin, Advil, Goody's, BC's, all herbal medications, fish oil, and all vitamins    Do not wear jewelry, make-up or nail polish.  Do not wear lotions, powders, or perfumes, or deoderant.  Do not shave 48 hours prior to surgery.  Men may shave face and neck.  Do not bring valuables to the hospital.  [facility] is not responsible for any belongings or valuables.  Contacts, dentures or bridgework may not be worn into surgery.  Leave your suitcase in the car.  After surgery it may be brought to your room.  For patients admitted to the hospital, discharge time will be determined by your treatment team.  Patients discharged the day of surgery will not be allowed to drive home.    Special instructions:   [place]- Preparing For Surgery  Before surgery, you can play an important role. Because skin is not sterile, your skin needs to be as free of germs as possible. You can reduce the number of germs on your skin by washing with CHG (chlorahexidine gluconate) Soap before surgery.  CHG is an antiseptic  cleaner which kills germs and bonds with the skin to continue killing germs even after washing.  Please do not use if you have an allergy to CHG or antibacterial soaps. If your skin becomes reddened/irritated stop using the CHG.  Do not shave (including legs and underarms) for at least 48 hours prior to first CHG shower. It is OK to shave your face.  Please follow these instructions carefully.   1. Shower the NIGHT BEFORE SURGERY and the MORNING OF SURGERY with CHG.   2. If you chose to wash your hair, wash your hair first as usual with your normal shampoo.  3. After you shampoo, rinse your hair and body thoroughly to remove the shampoo.  4. Use CHG as you would any other liquid soap. You can apply CHG directly to the skin and wash gently with a scrungie or a clean washcloth.   5. Apply the CHG Soap to your body ONLY FROM THE NECK DOWN.  Do not use on open wounds or open sores. Avoid contact with your eyes, ears, mouth and genitals (private parts). Wash genitals (private parts) with your normal soap.  6. Wash thoroughly, paying special attention to the area where your surgery will be performed.  7. Thoroughly rinse your body with warm water from the neck down.  8. DO NOT shower/wash with your normal soap after using and rinsing off the CHG Soap.  9. Pat yourself dry with  a CLEAN TOWEL.   10. Wear CLEAN PAJAMAS   11. Place CLEAN SHEETS on your bed the night of your first shower and DO NOT SLEEP WITH PETS.    Day of Surgery: Do not apply any deodorants/lotions. Please wear clean clothes to the hospital/surgery center.     Please read over the following fact sheets that you were given.

## 2016-10-12 NOTE — Pre-Procedure Instructions (Signed)
Caridad W Gilvin  10/12/2016      [facility] - [place], [place] - [name] [place] [address] Phone: [phone] Fax: [phone]    Your procedure is scheduled on September 26  Report to [place] at [place].M.  Call this number if you have problems the morning of surgery:  [phone]   Remember:  Do not eat food or drink liquids after midnight. Continue all other medications as directed by your physician except follow these medication instructions before surgery  Please complete your 8oz of Boost Breeze that was given to you at your preadmission appointment by 430am on the day of your surgery    Take these medicines the morning of surgery with A SIP OF WATER valACYclovir (VALTREX if needed  7 days prior to surgery STOP 10/12/16 taking any Aspirin, Aleve, Naproxen, Ibuprofen, Motrin, Advil, Goody's, BC's, all herbal medications, fish oil, and all vitamins    Do not wear jewelry, make-up or nail polish.  Do not wear lotions, powders, or perfumes, or deoderant.  Do not shave 48 hours prior to surgery.  Men may shave face and neck.  Do not bring valuables to the hospital.  [facility] is not responsible for any belongings or valuables.  Contacts, dentures or bridgework may not be worn into surgery.  Leave your suitcase in the car.  After surgery it may be brought to your room.  For patients admitted to the hospital, discharge time will be determined by your treatment team.  Patients discharged the day of surgery will not be allowed to drive home.    Special instructions:   [place]- Preparing For Surgery  Before surgery, you can play an important role. Because skin is not sterile, your skin needs to be as free of germs as possible. You can reduce the number of germs on your skin by washing with CHG (chlorahexidine gluconate) Soap before surgery.  CHG is an antiseptic  cleaner which kills germs and bonds with the skin to continue killing germs even after washing.  Please do not use if you have an allergy to CHG or antibacterial soaps. If your skin becomes reddened/irritated stop using the CHG.  Do not shave (including legs and underarms) for at least 48 hours prior to first CHG shower. It is OK to shave your face.  Please follow these instructions carefully.   1. Shower the NIGHT BEFORE SURGERY and the MORNING OF SURGERY with CHG.   2. If you chose to wash your hair, wash your hair first as usual with your normal shampoo.  3. After you shampoo, rinse your hair and body thoroughly to remove the shampoo.  4. Use CHG as you would any other liquid soap. You can apply CHG directly to the skin and wash gently with a scrungie or a clean washcloth.   5. Apply the CHG Soap to your body ONLY FROM THE NECK DOWN.  Do not use on open wounds or open sores. Avoid contact with your eyes, ears, mouth and genitals (private parts). Wash genitals (private parts) with your normal soap.  6. Wash thoroughly, paying special attention to the area where your surgery will be performed.  7. Thoroughly rinse your body with warm water from the neck down.  8. DO NOT shower/wash with your normal soap after using and rinsing off the CHG Soap.  9. Pat yourself dry with  a CLEAN TOWEL.   10. Wear CLEAN PAJAMAS   11. Place CLEAN SHEETS on your bed the night of your first shower and DO NOT SLEEP WITH PETS.    Day of Surgery: Do not apply any deodorants/lotions. Please wear clean clothes to the hospital/surgery center.     Please read over the following fact sheets that you were given.

## 2016-10-13 NOTE — H&P (Signed)
Christina Joseph Location: [facility] Patient #: [id number] DOB: 07-08-1965 Married / Language: English / Race: White Female        History of Present Illness     . This is a 51 year old female who returns following neoadjuvant chemotherapy to plan definitive right breast surgery for her breast cancer. Unfortunately the MRI is not scheduled until September 20 Dr. Burney Gauze and Dr. Sondra Come ar involved in her caree Her husband is present throughout the encounter.      Recall that she is healthy but noticed a lump in her right axilla and went for evaluation. She had not had a mammogram in 7 years. Imaging studies and Rondall Allegra at [facility] included a mammogram which showed a spiculated right breast mass in the upper outer quadrant of the right breast and multiple axillary lymph nodes. By ultrasound the right axillary lymph node was 5.7 cm. 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      MRI on April 30, 2016 confirmed a 3.7 cm mass in the medial right breast with architectural distortion and also showed a large heterogeneous right axillary mass injuring 6.9 cm. Dr. Servando Snare placed a Port-A-Cath. 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 CT of chest on September 19, 2016 showed a solitary will be enlarged right axillary node. No intrathoracic adenopathy. Tiny 3 mm subpleural left lower lobe pulmonary nodule. Left humeral metaphysis this showed a sclerotic lesion incompletely evaluated. Thought not to be cancer.     She has transferred her care here because she lives in [place] her husband works in [employer]. 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 Dr. Marin Olp evaluated her and referred her to me.  When I saw her she was thinking she needed a mastectomy but she may be a candidate for breast conservation surgery and so she now thinks she wants to do that if the MRI is favorable. The MRI is pending.      Family history reveals maternal grandmother had breast cancer and died of metastatic disease. Mother died of melanoma. Father died of kidney disease and CHF. She lives in [place]. Married with 1 daughter. Husband works in [employer]. Former Proofreader. Denies alcohol or tobacco.   The plan is as follows: She wants to go ahead and schedule for right breast lumpectomy with radioactive seed localization, complete right axillary lymph node dissection. The Port-A-Cath will be left in MRI breast is scheduled on September 20 at [facility]  (It looks favorable) The breast surgery will be scheduled after September 24 but before October 3  I will call her and discuss the MRI results prior to the surgery      I discussed the indications, details, techniques, and numerous risk of the surgery with the patient and her husband. She is aware of the risk of bleeding, infection, reoperation for margins, cosmetic deformity, nerve damage with chronic pain, shoulder disability, arm swelling. She knows She knows that arm swelling is likely. She knows sensory nerve damage is likely. Postoperative physical therapy is advised. She understands all of these issues. All of her questions were answered. She agrees with this plan.   Allergies  No Known Drug Allergies   Medication History  No Current Medications  Vitals ( Weight: 160.6 lb Height: 64in Body Surface Area: 1.78 m Body Mass Index: 27.57 kg/m  Temp.: 98.80F  Pulse: 106 (Regular)  BP: 110/80 (Sitting, Left Arm, Standard)   Physical Exam General Mental Status-Alert. General Appearance-Not in acute distress. Build & Nutrition-Well nourished. Posture-Normal posture. Gait-Normal. Note: BMI 27   Head and  Neck Head-normocephalic, atraumatic with no lesions or palpable masses. Trachea-midline. Thyroid Gland Characteristics - normal size and consistency and no palpable nodules. Note: Complete alopecia   Chest and Lung Exam Chest and lung exam reveals -on auscultation, normal breath sounds, no adventitious sounds and normal vocal resonance. Note: Port-A-Cath palpable left infraclavicular area looks fine.   Breast Note: Medium slightly large. Vague thickening right breast but not obvious or discrete. Obvious palpable mass in the right axilla at least 3 cm in size but nontender. Slightly mobile. No arm swelling. Normal range of motion right shoulder. No other skin changes or masses in either breast. Left axilla negative. No supraclavicular adenopathy.   Cardiovascular Cardiovascular examination reveals -normal heart sounds, regular rate and rhythm with no murmurs and femoral artery auscultation bilaterally reveals normal pulses, no bruits, no thrills.  Abdomen Inspection Inspection of the abdomen reveals - No Hernias. Palpation/Percussion Palpation and Percussion of the abdomen reveal - Soft, Non Tender, No Rigidity (guarding), No hepatosplenomegaly and No Palpable abdominal masses.  Neurologic Neurologic evaluation reveals -alert and oriented x 3 with no impairment of recent or remote memory, normal attention span and ability to concentrate, normal sensation and normal coordination.  Musculoskeletal Normal Exam - Bilateral-Upper Extremity Strength Normal and Lower Extremity Strength Normal.    Assessment & Plan PRIMARY CANCER OF UPPER INNER QUADRANT OF RIGHT FEMALE BREAST (C50.211).        Your breast MRI is not scheduled until September 20 This will delay your surgery a little bit but will not have any effect on your outcome I have talked with Dr. Marin Olp and he wants to leave the port in  We talked a long time about logistics of planning the surgery as well as  your opinions regarding the extent of surgery you may well be a candidate for breast conservation. you state that if the MRI shows that you are a good candidate for breast conservation then you want to have a lumpectomy I think that is very reasonable and is actually probably likely you wanted to go ahead and make plans  you will be tentatively scheduled for right breast lumpectomy with radioactive seed localization and complete right axillary lymph node dissection We will schedule this after September 24 The radioactive seed will be placed at the breast center at [facility] 1-3 days preop The MRI (10/11/2016)  shows a good partial response to therapy. You are a good candidate for breast conservation. You still need a complete axillary node dissection.   PRIMARY MALIGNANT NEOPLASM OF RIGHT BREAST WITH METASTASIS TO MOVABLE IPSILATERAL LEVEL 1 OR 2 AXILLARY LYMPH NODES (N1) (C50.911)    Sten Dematteo M. Dalbert Batman, M.D., [facility] General and Minimally invasive Surgery Breast and Colorectal Surgery Office:   [phone] Pager:   [phone]

## 2016-10-16 ENCOUNTER — Encounter (HOSPITAL_COMMUNITY): Payer: Self-pay | Admitting: Anesthesiology

## 2016-10-16 ENCOUNTER — Ambulatory Visit
Admission: RE | Admit: 2016-10-16 | Discharge: 2016-10-16 | Disposition: A | Discharge status: Home / Self Care | Payer: BC Managed Care – PPO | Source: Ambulatory Visit | Attending: General Surgery | Admitting: General Surgery

## 2016-10-16 DIAGNOSIS — C50911 Malignant neoplasm of unspecified site of right female breast: Secondary | ICD-10-CM

## 2016-10-16 DIAGNOSIS — Z170 Estrogen receptor positive status [ER+]: Principal | ICD-10-CM

## 2016-10-16 NOTE — Anesthesia Preprocedure Evaluation (Addendum)
Anesthesia Evaluation  Patient identified by MRN, date of birth, ID band Patient awake    Reviewed: Allergy & Precautions, NPO status , Patient's Chart, lab work & pertinent test results  Airway Mallampati: II  TM Distance: >3 FB Neck ROM: Full    Dental  (+) Dental Advisory Given   Pulmonary neg pulmonary ROS,    breath sounds clear to auscultation       Cardiovascular negative cardio ROS   Rhythm:Regular Rate:Normal  08/2016: Left ventricle: The cavity size was normal. Wall thickness was normal. Systolic function was normal. The estimated ejection fraction was in the range of 60% to 65%. Wall motion was normal  there were no regional wall motion abnormalities. Doppler parameters are consistent with abnormal left ventricular relaxation (grade 1 diastolic dysfunction). GLS:19.4%   Neuro/Psych Depression negative neurological ROS     GI/Hepatic Neg liver ROS, GERD  ,  Endo/Other  Breast CA  Renal/GU negative Renal ROS     Musculoskeletal   Abdominal   Peds  Hematology  (+) anemia ,   Anesthesia Other Findings   Reproductive/Obstetrics                            Lab Results  Component Value Date   WBC 4.8 10/12/2016   HGB 10.8 (L) 10/12/2016   HCT 34.3 (L) 10/12/2016   MCV 87.7 10/12/2016   PLT 231 10/12/2016   Lab Results  Component Value Date   CREATININE 0.66 10/12/2016   BUN <5 (L) 10/12/2016   NA 140 10/12/2016   K 4.0 10/12/2016   CL 107 10/12/2016   CO2 26 10/12/2016    Anesthesia Physical Anesthesia Plan  ASA: II  Anesthesia Plan: General   Post-op Pain Management:  Regional for Post-op pain   Induction: Intravenous  PONV Risk Score and Plan: 3 and Ondansetron, Dexamethasone, Midazolam and Treatment may vary due to age or medical condition  Airway Management Planned: LMA  Additional Equipment:   Intra-op Plan:   Post-operative Plan: Extubation in  OR  Informed Consent: I have reviewed the patients History and Physical, chart, labs and discussed the procedure including the risks, benefits and alternatives for the proposed anesthesia with the patient or authorized representative who has indicated his/her understanding and acceptance.   Dental advisory given  Plan Discussed with: CRNA  Anesthesia Plan Comments:        Anesthesia Quick Evaluation

## 2016-10-17 ENCOUNTER — Ambulatory Visit (HOSPITAL_COMMUNITY): Payer: BC Managed Care – PPO | Admitting: Anesthesiology

## 2016-10-17 ENCOUNTER — Encounter (HOSPITAL_COMMUNITY): Payer: Self-pay | Admitting: *Deleted

## 2016-10-17 ENCOUNTER — Ambulatory Visit (HOSPITAL_COMMUNITY)
Admission: RE | Admit: 2016-10-17 | Discharge: 2016-10-18 | Disposition: A | Discharge status: Home / Self Care | Payer: BC Managed Care – PPO | Source: Ambulatory Visit | Attending: General Surgery | Admitting: General Surgery

## 2016-10-17 ENCOUNTER — Encounter (HOSPITAL_COMMUNITY)
Admission: RE | Disposition: A | Discharge status: Home / Self Care | Payer: Self-pay | Source: Ambulatory Visit | Attending: General Surgery

## 2016-10-17 ENCOUNTER — Ambulatory Visit
Admission: RE | Admit: 2016-10-17 | Discharge: 2016-10-17 | Disposition: A | Discharge status: Home / Self Care | Payer: BC Managed Care – PPO | Source: Ambulatory Visit | Attending: General Surgery | Admitting: General Surgery

## 2016-10-17 DIAGNOSIS — Z8249 Family history of ischemic heart disease and other diseases of the circulatory system: Secondary | ICD-10-CM | POA: Insufficient documentation

## 2016-10-17 DIAGNOSIS — C50111 Malignant neoplasm of central portion of right female breast: Secondary | ICD-10-CM | POA: Diagnosis not present

## 2016-10-17 DIAGNOSIS — C50211 Malignant neoplasm of upper-inner quadrant of right female breast: Secondary | ICD-10-CM

## 2016-10-17 DIAGNOSIS — Z9221 Personal history of antineoplastic chemotherapy: Secondary | ICD-10-CM | POA: Diagnosis not present

## 2016-10-17 DIAGNOSIS — Z803 Family history of malignant neoplasm of breast: Secondary | ICD-10-CM | POA: Insufficient documentation

## 2016-10-17 DIAGNOSIS — Z170 Estrogen receptor positive status [ER+]: Principal | ICD-10-CM

## 2016-10-17 DIAGNOSIS — C773 Secondary and unspecified malignant neoplasm of axilla and upper limb lymph nodes: Secondary | ICD-10-CM | POA: Diagnosis not present

## 2016-10-17 DIAGNOSIS — C50911 Malignant neoplasm of unspecified site of right female breast: Secondary | ICD-10-CM

## 2016-10-17 HISTORY — PX: BREAST LUMPECTOMY WITH RADIOACTIVE SEED AND AXILLARY LYMPH NODE DISSECTION: SHX6656

## 2016-10-17 HISTORY — PX: AXILLARY NODE DISSECTION: SHX1211

## 2016-10-17 HISTORY — PX: BREAST LUMPECTOMY: SHX2

## 2016-10-17 LAB — HCG, SERUM, QUALITATIVE: Preg, Serum: NEGATIVE

## 2016-10-17 SURGERY — BREAST LUMPECTOMY WITH RADIOACTIVE SEED AND AXILLARY LYMPH NODE DISSECTION
Anesthesia: General | Site: Breast | Laterality: Right

## 2016-10-17 MED ORDER — FENTANYL CITRATE (PF) 100 MCG/2ML IJ SOLN
INTRAMUSCULAR | Status: AC
  Filled 2016-10-17: qty 2

## 2016-10-17 MED ORDER — HYDROCODONE-ACETAMINOPHEN 5-325 MG PO TABS
1.0000 | ORAL_TABLET | ORAL | Status: DC | PRN
  Administered 2016-10-17 (×2): 2 via ORAL
  Administered 2016-10-17: 1 via ORAL
  Filled 2016-10-17 (×2): qty 2

## 2016-10-17 MED ORDER — HYDROMORPHONE HCL 1 MG/ML IJ SOLN
INTRAMUSCULAR | Status: AC
  Administered 2016-10-17: 0.5 mg via INTRAVENOUS
  Filled 2016-10-17: qty 1

## 2016-10-17 MED ORDER — ONDANSETRON HCL 4 MG/2ML IJ SOLN
INTRAMUSCULAR | Status: DC | PRN
  Administered 2016-10-17: 4 mg via INTRAVENOUS

## 2016-10-17 MED ORDER — POTASSIUM CHLORIDE 2 MEQ/ML IV SOLN
INTRAVENOUS | Status: DC
  Administered 2016-10-17 – 2016-10-18 (×2): via INTRAVENOUS
  Filled 2016-10-17 (×5): qty 1000

## 2016-10-17 MED ORDER — METHYLENE BLUE 0.5 % INJ SOLN
INTRAVENOUS | Status: AC
Start: 2016-10-17 — End: 2016-10-17
  Filled 2016-10-17: qty 10

## 2016-10-17 MED ORDER — METHOCARBAMOL 500 MG PO TABS
ORAL_TABLET | ORAL | Status: AC
  Administered 2016-10-17: 500 mg via ORAL
  Filled 2016-10-17: qty 1

## 2016-10-17 MED ORDER — DEXAMETHASONE SODIUM PHOSPHATE 10 MG/ML IJ SOLN
INTRAMUSCULAR | Status: DC | PRN
  Administered 2016-10-17: 10 mg via INTRAVENOUS

## 2016-10-17 MED ORDER — BUPIVACAINE-EPINEPHRINE (PF) 0.5% -1:200000 IJ SOLN
INTRAMUSCULAR | Status: DC | PRN
  Administered 2016-10-17: 30 mL

## 2016-10-17 MED ORDER — HYDROMORPHONE HCL 1 MG/ML IJ SOLN
0.2500 mg | INTRAMUSCULAR | Status: DC | PRN
  Administered 2016-10-17 (×2): 0.5 mg via INTRAVENOUS

## 2016-10-17 MED ORDER — FENTANYL CITRATE (PF) 250 MCG/5ML IJ SOLN
INTRAMUSCULAR | Status: AC
  Filled 2016-10-17: qty 5

## 2016-10-17 MED ORDER — BUPIVACAINE-EPINEPHRINE (PF) 0.25% -1:200000 IJ SOLN
INTRAMUSCULAR | Status: AC
  Filled 2016-10-17: qty 30

## 2016-10-17 MED ORDER — LACTATED RINGERS IV SOLN
INTRAVENOUS | Status: DC
  Administered 2016-10-17 (×2): via INTRAVENOUS

## 2016-10-17 MED ORDER — CEFAZOLIN SODIUM-DEXTROSE 2-4 GM/100ML-% IV SOLN
2.0000 g | INTRAVENOUS | Status: AC
  Administered 2016-10-17: 2 g via INTRAVENOUS
  Filled 2016-10-17: qty 100

## 2016-10-17 MED ORDER — NEOSTIGMINE METHYLSULFATE 5 MG/5ML IV SOSY
PREFILLED_SYRINGE | INTRAVENOUS | Status: AC
  Filled 2016-10-17: qty 5

## 2016-10-17 MED ORDER — ENOXAPARIN SODIUM 40 MG/0.4ML ~~LOC~~ SOLN: 40.0000 mg | SUBCUTANEOUS | Status: DC

## 2016-10-17 MED ORDER — PROPOFOL 10 MG/ML IV BOLUS
INTRAVENOUS | Status: AC
  Filled 2016-10-17: qty 20

## 2016-10-17 MED ORDER — BUPIVACAINE-EPINEPHRINE 0.25% -1:200000 IJ SOLN
INTRAMUSCULAR | Status: DC | PRN
  Administered 2016-10-17: 15 mL

## 2016-10-17 MED ORDER — ONDANSETRON HCL 4 MG/2ML IJ SOLN
INTRAMUSCULAR | Status: AC
  Filled 2016-10-17: qty 2

## 2016-10-17 MED ORDER — SODIUM CHLORIDE 0.9% FLUSH: 10.0000 mL | INTRAVENOUS | Status: DC | PRN

## 2016-10-17 MED ORDER — SENNA 8.6 MG PO TABS
1.0000 | ORAL_TABLET | Freq: Two times a day (BID) | ORAL | Status: DC
  Administered 2016-10-17: 8.6 mg via ORAL
  Filled 2016-10-17: qty 1

## 2016-10-17 MED ORDER — PHENYLEPHRINE 40 MCG/ML (10ML) SYRINGE FOR IV PUSH (FOR BLOOD PRESSURE SUPPORT)
PREFILLED_SYRINGE | INTRAVENOUS | Status: AC
  Filled 2016-10-17: qty 10

## 2016-10-17 MED ORDER — DEXAMETHASONE SODIUM PHOSPHATE 10 MG/ML IJ SOLN
INTRAMUSCULAR | Status: AC
  Filled 2016-10-17: qty 1

## 2016-10-17 MED ORDER — TRAMADOL HCL 50 MG PO TABS: 50.0000 mg | ORAL_TABLET | Freq: Four times a day (QID) | ORAL | Status: DC | PRN

## 2016-10-17 MED ORDER — PROPOFOL 10 MG/ML IV BOLUS
INTRAVENOUS | Status: DC | PRN
  Administered 2016-10-17: 200 mg via INTRAVENOUS

## 2016-10-17 MED ORDER — SODIUM CHLORIDE 0.9 % IJ SOLN
INTRAMUSCULAR | Status: AC
  Filled 2016-10-17: qty 10

## 2016-10-17 MED ORDER — MIDAZOLAM HCL 2 MG/2ML IJ SOLN
INTRAMUSCULAR | Status: AC
  Filled 2016-10-17: qty 2

## 2016-10-17 MED ORDER — SUCCINYLCHOLINE CHLORIDE 200 MG/10ML IV SOSY
PREFILLED_SYRINGE | INTRAVENOUS | Status: AC
  Filled 2016-10-17: qty 10

## 2016-10-17 MED ORDER — PROMETHAZINE HCL 25 MG/ML IJ SOLN: 6.2500 mg | INTRAMUSCULAR | Status: DC | PRN

## 2016-10-17 MED ORDER — CHLORHEXIDINE GLUCONATE CLOTH 2 % EX PADS: 6.0000 | MEDICATED_PAD | Freq: Once | CUTANEOUS | Status: DC

## 2016-10-17 MED ORDER — ONDANSETRON HCL 4 MG/2ML IJ SOLN: 4.0000 mg | Freq: Four times a day (QID) | INTRAMUSCULAR | Status: DC | PRN

## 2016-10-17 MED ORDER — ONDANSETRON 4 MG PO TBDP: 4.0000 mg | ORAL_TABLET | Freq: Four times a day (QID) | ORAL | Status: DC | PRN

## 2016-10-17 MED ORDER — LIDOCAINE 2% (20 MG/ML) 5 ML SYRINGE
INTRAMUSCULAR | Status: DC | PRN
  Administered 2016-10-17: 20 mg via INTRAVENOUS

## 2016-10-17 MED ORDER — CELECOXIB 200 MG PO CAPS
200.0000 mg | ORAL_CAPSULE | ORAL | Status: AC
  Administered 2016-10-17: 200 mg via ORAL
  Filled 2016-10-17: qty 1

## 2016-10-17 MED ORDER — MIDAZOLAM HCL 5 MG/5ML IJ SOLN
INTRAMUSCULAR | Status: DC | PRN
  Administered 2016-10-17: 2 mg via INTRAVENOUS

## 2016-10-17 MED ORDER — ACETAMINOPHEN 500 MG PO TABS
1000.0000 mg | ORAL_TABLET | ORAL | Status: AC
  Administered 2016-10-17: 1000 mg via ORAL
  Filled 2016-10-17: qty 2

## 2016-10-17 MED ORDER — FENTANYL CITRATE (PF) 100 MCG/2ML IJ SOLN
100.0000 ug | Freq: Once | INTRAMUSCULAR | Status: AC
  Administered 2016-10-17: 100 ug via INTRAVENOUS

## 2016-10-17 MED ORDER — GABAPENTIN 300 MG PO CAPS
300.0000 mg | ORAL_CAPSULE | ORAL | Status: AC
  Administered 2016-10-17: 300 mg via ORAL
  Filled 2016-10-17: qty 1

## 2016-10-17 MED ORDER — 0.9 % SODIUM CHLORIDE (POUR BTL) OPTIME
TOPICAL | Status: DC | PRN
  Administered 2016-10-17 (×2): 1000 mL

## 2016-10-17 MED ORDER — EPHEDRINE SULFATE 50 MG/ML IJ SOLN
INTRAMUSCULAR | Status: AC
  Filled 2016-10-17: qty 1

## 2016-10-17 MED ORDER — HYDROMORPHONE HCL 1 MG/ML IJ SOLN: 1.0000 mg | INTRAMUSCULAR | Status: DC | PRN

## 2016-10-17 MED ORDER — LIDOCAINE 2% (20 MG/ML) 5 ML SYRINGE
INTRAMUSCULAR | Status: AC
  Filled 2016-10-17: qty 10

## 2016-10-17 MED ORDER — MIDAZOLAM HCL 2 MG/2ML IJ SOLN
2.0000 mg | Freq: Once | INTRAMUSCULAR | Status: AC
  Administered 2016-10-17: 2 mg via INTRAVENOUS

## 2016-10-17 MED ORDER — ROCURONIUM BROMIDE 10 MG/ML (PF) SYRINGE
PREFILLED_SYRINGE | INTRAVENOUS | Status: AC
  Filled 2016-10-17: qty 5

## 2016-10-17 MED ORDER — FENTANYL CITRATE (PF) 100 MCG/2ML IJ SOLN
INTRAMUSCULAR | Status: DC | PRN
  Administered 2016-10-17 (×2): 25 ug via INTRAVENOUS
  Administered 2016-10-17: 50 ug via INTRAVENOUS
  Administered 2016-10-17: 25 ug via INTRAVENOUS

## 2016-10-17 MED ORDER — VALACYCLOVIR HCL 500 MG PO TABS: 1000.0000 mg | ORAL_TABLET | Freq: Every day | ORAL | Status: DC

## 2016-10-17 MED ORDER — METHOCARBAMOL 500 MG PO TABS
500.0000 mg | ORAL_TABLET | Freq: Four times a day (QID) | ORAL | Status: DC | PRN
  Administered 2016-10-17: 500 mg via ORAL

## 2016-10-17 MED ORDER — HYDROCODONE-ACETAMINOPHEN 5-325 MG PO TABS
ORAL_TABLET | ORAL | Status: AC
  Administered 2016-10-17: 1 via ORAL
  Filled 2016-10-17: qty 1

## 2016-10-17 SURGICAL SUPPLY — 49 items
APPLIER CLIP 9.375 MED OPEN (MISCELLANEOUS) ×6
BINDER BREAST LRG (GAUZE/BANDAGES/DRESSINGS) IMPLANT
BINDER BREAST XLRG (GAUZE/BANDAGES/DRESSINGS) ×3 IMPLANT
BLADE CLIPPER SURG (BLADE) IMPLANT
CANISTER SUCT 3000ML PPV (MISCELLANEOUS) ×3 IMPLANT
CHLORAPREP W/TINT 26ML (MISCELLANEOUS) ×3 IMPLANT
CLIP APPLIE 9.375 MED OPEN (MISCELLANEOUS) ×2 IMPLANT
CONT SPEC 4OZ CLIKSEAL STRL BL (MISCELLANEOUS) ×3 IMPLANT
COVER PROBE W GEL 5X96 (DRAPES) ×3 IMPLANT
COVER SURGICAL LIGHT HANDLE (MISCELLANEOUS) ×3 IMPLANT
DERMABOND ADVANCED (GAUZE/BANDAGES/DRESSINGS) ×2
DERMABOND ADVANCED .7 DNX12 (GAUZE/BANDAGES/DRESSINGS) ×1 IMPLANT
DEVICE DUBIN SPECIMEN MAMMOGRA (MISCELLANEOUS) ×3 IMPLANT
DRAIN CHANNEL 19F RND (DRAIN) ×3 IMPLANT
DRAPE LAPAROSCOPIC ABDOMINAL (DRAPES) ×3 IMPLANT
DRAPE UTILITY XL STRL (DRAPES) ×6 IMPLANT
DRSG PAD ABDOMINAL 8X10 ST (GAUZE/BANDAGES/DRESSINGS) ×3 IMPLANT
ELECT CAUTERY BLADE 6.4 (BLADE) ×3 IMPLANT
ELECT REM PT RETURN 9FT ADLT (ELECTROSURGICAL) ×3
ELECTRODE REM PT RTRN 9FT ADLT (ELECTROSURGICAL) ×1 IMPLANT
EVACUATOR SILICONE 100CC (DRAIN) ×3 IMPLANT
GAUZE SPONGE 4X4 12PLY STRL (GAUZE/BANDAGES/DRESSINGS) ×6 IMPLANT
GLOVE EUDERMIC 7 POWDERFREE (GLOVE) ×3 IMPLANT
GOWN STRL REUS W/ TWL LRG LVL3 (GOWN DISPOSABLE) ×1 IMPLANT
GOWN STRL REUS W/ TWL XL LVL3 (GOWN DISPOSABLE) ×1 IMPLANT
GOWN STRL REUS W/TWL LRG LVL3 (GOWN DISPOSABLE) ×2
GOWN STRL REUS W/TWL XL LVL3 (GOWN DISPOSABLE) ×2
ILLUMINATOR WAVEGUIDE N/F (MISCELLANEOUS) IMPLANT
KIT BASIN OR (CUSTOM PROCEDURE TRAY) ×3 IMPLANT
KIT MARKER MARGIN INK (KITS) ×3 IMPLANT
KIT ROOM TURNOVER OR (KITS) ×3 IMPLANT
LIGHT WAVEGUIDE WIDE FLAT (MISCELLANEOUS) IMPLANT
NEEDLE 18GX1X1/2 (RX/OR ONLY) (NEEDLE) ×3 IMPLANT
NEEDLE FILTER BLUNT 18X 1/2SAF (NEEDLE)
NEEDLE FILTER BLUNT 18X1 1/2 (NEEDLE) IMPLANT
NEEDLE HYPO 25GX1X1/2 BEV (NEEDLE) ×6 IMPLANT
NS IRRIG 1000ML POUR BTL (IV SOLUTION) ×3 IMPLANT
PACK GENERAL/GYN (CUSTOM PROCEDURE TRAY) ×3 IMPLANT
PAD ABD 8X10 STRL (GAUZE/BANDAGES/DRESSINGS) ×3 IMPLANT
PAD ARMBOARD 7.5X6 YLW CONV (MISCELLANEOUS) ×3 IMPLANT
SPONGE LAP 4X18 X RAY DECT (DISPOSABLE) ×9 IMPLANT
SUT ETHILON 2 0 FS 18 (SUTURE) ×3 IMPLANT
SUT MNCRL AB 4-0 PS2 18 (SUTURE) ×6 IMPLANT
SUT SILK 2 0 SH (SUTURE) ×3 IMPLANT
SUT VIC AB 3-0 SH 18 (SUTURE) ×9 IMPLANT
SUT VICRYL AB 3 0 TIES (SUTURE) IMPLANT
SYR CONTROL 10ML LL (SYRINGE) ×6 IMPLANT
TOWEL OR 17X24 6PK STRL BLUE (TOWEL DISPOSABLE) ×3 IMPLANT
TOWEL OR 17X26 10 PK STRL BLUE (TOWEL DISPOSABLE) ×3 IMPLANT

## 2016-10-17 NOTE — Anesthesia Postprocedure Evaluation (Signed)
Anesthesia Post Note  Patient: Christina Joseph  Procedure(s) Performed: Procedure(s) (LRB): RIGHT BREAST LUMPECTOMY WITH RADIOACTIVE SEED AND RIGHT COMPLETE AXILLARY LYMPH NODE DISSECTION ERAS PATHWAY (Right)     Patient location during evaluation: PACU Anesthesia Type: General Level of consciousness: awake and alert Pain management: pain level controlled Vital Signs Assessment: post-procedure vital signs reviewed and stable Respiratory status: spontaneous breathing, nonlabored ventilation, respiratory function stable and patient connected to nasal cannula oxygen Cardiovascular status: blood pressure returned to baseline and stable Postop Assessment: no apparent nausea or vomiting Anesthetic complications: no    Last Vitals:  Vitals:   10/17/16 1150 10/17/16 1205  BP: 129/81   Pulse: 72   Resp: 10   Temp:  36.6 C  SpO2: 99%     Last Pain:  Vitals:   10/17/16 1205  TempSrc:   PainSc: 3                  Tiajuana Amass

## 2016-10-17 NOTE — Anesthesia Procedure Notes (Signed)
Procedure Name: LMA Insertion Date/Time: 10/17/2016 8:30 AM Performed by: Merrilyn Puma B Pre-anesthesia Checklist: Patient identified, Emergency Drugs available, Suction available, Patient being monitored and Timeout performed Patient Re-evaluated:Patient Re-evaluated prior to induction Oxygen Delivery Method: Circle system utilized Preoxygenation: Pre-oxygenation with 100% oxygen Induction Type: IV induction Ventilation: Mask ventilation without difficulty LMA: LMA inserted LMA Size: 4.0 Number of attempts: 1 Placement Confirmation: positive ETCO2 and breath sounds checked- equal and bilateral Dental Injury: Teeth and Oropharynx as per pre-operative assessment

## 2016-10-17 NOTE — Op Note (Signed)
Patient Name:           Christina Joseph   Date of Surgery:        10/17/2016  Pre op Diagnosis:      Invasive cancer right breast, central, receptor positive with ipsilateral lymph node metastasis                                       Status post neoadjuvant chemotherapy  Post op Diagnosis:    Same  Procedure:                 Right breast lumpectomy with radioactive seed localization                                      Complete right axillary lymph node dissection  Surgeon:                     Edsel Petrin. Dalbert Batman, M.D., FACS  Assistant:                      OR staff   Indication for Assistant: N/A  Operative Indications:   This is a 51 year old female who returns following neoadjuvant chemotherapy to plan definitive right breast surgery for her breast cancer.  Dr. Burney Gauze and Dr. Sondra Come ar involved in her care.       she is healthy but noticed a lump in her right axilla and went for evaluation. She had not had a mammogram in 7 years. Imaging studies and Rondall Allegra at [facility] included a mammogram which showed a spiculated right breast mass in the deep medial quadrant of the right breast and multiple axillary lymph nodes. By ultrasound the right axillary lymph node was 5.7 cm. 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      MRI on April 30, 2016 confirmed a 3.7 cm mass in the medial right breast with architectural distortion and also showed a large heterogeneous right axillary mass injuring 6.9 cm. Dr. Servando Snare placed a Port-A-Cath. 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 CT of chest on September 19, 2016 showed a solitary will be enlarged right axillary node. No intrathoracic adenopathy. Tiny 3 mm subpleural left  lower lobe pulmonary nodule. Left humeral metaphysis this showed a sclerotic lesion incompletely evaluated. Thought not to be cancer.     She has transferred her care here because she lives in [place] her husband works in [place]. Dr. Marin Olp evaluated her and referred her to me. When I saw her she was thinking she needed a mastectomy but she may be a candidate for breast conservation surgery and so she now thinks she wants to do.  Her into treatment MRI showed decrease in the size of the axillary lymph nodes and significant decrease in the size of the breast tumor.  She is a candidate for lumpectomy.      Family history reveals maternal grandmother had breast cancer and died of metastatic disease. Mother died of melanoma. Father died of kidney disease and CHF.n. Dr. Rosiland Oz request  that The Port-A-Cath will be left in.      I discussed the indications, details, techniques, and numerous risk of the surgery with the patient and her husband.  She agrees with this plan.  Operative Findings:       The original titanium marker clip and the radioactive seed were very close to one another in the central breast medially at the 3:00 position.  Because of the original size of the tumor, I did a generous lumpectomy, at least 7 cm in diameter and the specimen mammogram looked very good with the marker clip and seed in the center of the specimen.  There were matted palpable right axillary lymph nodes.  I did a complete level I and level II right axillary dissection.  Procedure in Detail:          Following the induction of general LMA anesthesia the patient's right breast and chest wall and axilla were prepped and draped in a sterile fashion.  Surgical timeout was performed.  Intravenous antibiotics were given.  0.5% Marcaine with epinephrine was used as local infiltration anesthetic to  supplement the pectoral block that was performed by the anesthesiologist.     Using the neoprobe I identified the area  of the radioactive seed.  I made a transverse radial incision in the right breast at the 3:00 position.  The lumpectomy was performed with electrocautery and the neoprobe.  The specimen was removed and marked with silk sutures and a 6 color ink kit to orient the pathologist.  Specimen mammogram looked very good as described above.  Specimen was marked and sent to the lab where the seed was retrieved.  The lumpectomy incision was irrigated.  Hemostasis was excellent.  I placed 5 metal marker clips in the walls of the lumpectomy cavity.  The breast tissues were closed in 2 separate layers with interrupted 3-0 Vicryl and the skin closed with a running subcutaneous 4-0 Monocryl and Dermabond.     A transverse incision was made in the right axilla at the hairline.  Dissection was carried down through subcutaneous tissue.  The clavipectoral fascia was incised and  the axilla entered.  Tissues were somewhat thickened and leathery presumably due to chemotherapy effect and tumor.  I slowly dissected the axillary contents away from the pectoralis major and minor muscles and retracted this medially.  I carried the dissection  up to the axillary vein.  I took down the superficial tributaries of the axillary vein and controlled these with metal clips and divided them.  I took out all the axillary contents below the axillary vein preserving the thoracodorsal neurovascular bundle and the long thoracic nerve.  This adenopathy was somewhat bulky but was not fixed to the chest wall.  The axillary  contents were sent to pathology as a separate specimen.  The axillary space was copiously irrigated.  We spent a moderate amount of time making sure that it was completely hemostatic.  19 French Blake drain was placed up and the axillae space and brought out through a separate stab incision inferolaterally and sutured to the skin and connected to a suction bulb.  The clavipectoral fascia was closed with 3-0 Vicryl sutures and the skin  closed with a running subcuticular suture   of 4-0 Monocryl and Dermabond.  Dry bandages and a breast binder were placed.  The patient tolerated the procedure well was taken to PACU in stable condition.  EBL 30 mL.  Counts correct.  Complications none.       Haywood M. Ingram, M.D., FACS General and Minimally Invasive Surgery Breast and Colorectal Surgery  10/17/2016 10:19 AM  

## 2016-10-17 NOTE — Interval H&P Note (Signed)
History and Physical Interval Note:  10/17/2016 7:32 AM  Crimson W Tersigni  has presented today for surgery, with the diagnosis of cancer right breast  The various methods of treatment have been discussed with the patient and family. After consideration of risks, benefits and other options for treatment, the patient has consented to  Procedure(s) with comments: RIGHT BREAST LUMPECTOMY WITH RADIOACTIVE SEED AND RIGHT COMPLETE AXILLARY LYMPH NODE DISSECTION ERAS PATHWAY (Right) - PECTORAL BLOCK as a surgical intervention .  The patient's history has been reviewed, patient examined, no change in status, stable for surgery.  I have reviewed the patient's chart and labs.  Questions were answered to the patient's satisfaction.     Adin Hector

## 2016-10-17 NOTE — Transfer of Care (Signed)
Immediate Anesthesia Transfer of Care Note  Patient: Christina Joseph  Procedure(s) Performed: Procedure(s) with comments: RIGHT BREAST LUMPECTOMY WITH RADIOACTIVE SEED AND RIGHT COMPLETE AXILLARY LYMPH NODE DISSECTION ERAS PATHWAY (Right) - PECTORAL BLOCK  Patient Location: PACU  Anesthesia Type:General  Level of Consciousness: sedated  Airway & Oxygen Therapy: Patient Spontanous Breathing and Patient connected to nasal cannula oxygen  Post-op Assessment: Report given to RN and Post -op Vital signs reviewed and stable  Post vital signs: Reviewed and stable  Last Vitals:  Vitals:   10/17/16 0755 10/17/16 0800  BP: (!) 148/85 (!) 154/78  Pulse: 93 88  Resp: (!) 30 20  Temp:    SpO2: 99% 100%    Last Pain:  Vitals:   10/17/16 0646  TempSrc: Oral      Patients Stated Pain Goal: 2 (65/03/54 6568)  Complications: No apparent anesthesia complications

## 2016-10-17 NOTE — Anesthesia Procedure Notes (Signed)
Anesthesia Regional Block: Pectoralis block   Pre-Anesthetic Checklist: ,, timeout performed, Correct Patient, Correct Site, Correct Laterality, Correct Procedure, Correct Position, site marked, Risks and benefits discussed,  Surgical consent,  Pre-op evaluation,  At surgeon's request and post-op pain management  Laterality: Right  Prep: chloraprep       Needles:  Injection technique: Single-shot  Needle Type: Echogenic Needle     Needle Length: 9cm  Needle Gauge: 21     Additional Needles:   Procedures:,,,, ultrasound used (permanent image in chart),,,,  Narrative:  Start time: 10/17/2016 7:45 AM End time: 10/17/2016 7:52 AM Injection made incrementally with aspirations every 5 mL.  Performed by: Personally  Anesthesiologist: Suzette Battiest

## 2016-10-17 NOTE — Progress Notes (Signed)
Patient arrived to 6n25, alert and oriented, mild pain to incision site. VSS, IV fluids running, SCD's on. Patient noted to have 2 incisions on right breast with skin glue, gauze, ABD pad, and breast binder. Under medial incision patient has a JP drain. Family in waiting room, oriented patient to room and staff, will continue to monitor.

## 2016-10-18 ENCOUNTER — Encounter (HOSPITAL_COMMUNITY): Payer: Self-pay | Admitting: General Surgery

## 2016-10-18 DIAGNOSIS — C50111 Malignant neoplasm of central portion of right female breast: Secondary | ICD-10-CM | POA: Diagnosis not present

## 2016-10-18 LAB — CBC
HCT: 29 % — ABNORMAL LOW (ref 36.0–46.0)
Hemoglobin: 9 g/dL — ABNORMAL LOW (ref 12.0–15.0)
MCH: 26.8 pg (ref 26.0–34.0)
MCHC: 31 g/dL (ref 30.0–36.0)
MCV: 86.3 fL (ref 78.0–100.0)
Platelets: 212 10*3/uL (ref 150–400)
RBC: 3.36 MIL/uL — ABNORMAL LOW (ref 3.87–5.11)
RDW: 15 % (ref 11.5–15.5)
WBC: 8.3 10*3/uL (ref 4.0–10.5)

## 2016-10-18 LAB — CREATININE, SERUM
CREATININE: 0.56 mg/dL (ref 0.44–1.00)
GFR calc Af Amer: >60 mL/min (ref >60)

## 2016-10-18 MED ORDER — HEPARIN SOD (PORK) LOCK FLUSH 100 UNIT/ML IV SOLN
500.0000 [IU] | INTRAVENOUS | Status: AC | PRN
  Administered 2016-10-18: 500 [IU]

## 2016-10-18 MED ORDER — HYDROCODONE-ACETAMINOPHEN 5-325 MG PO TABS: 1.0000 | ORAL_TABLET | ORAL | 0 refills | Status: DC | PRN

## 2016-10-18 NOTE — Discharge Summary (Signed)
Patient ID: Christina Joseph [id number] 51 y.o. [birth year]  Admit date: 10/17/2016  Discharge date and time: 10/18/2016  Admitting Physician: Adin Hector  Discharge Physician: Adin Hector  Admission Diagnoses: cancer right breast  Discharge Diagnoses: invasive cancer right breast, central, receptor positive, with ipsilateral lymph node metastasis                                         Status post neoadjuvant chemotherapy  Operations: Procedure(s): RIGHT BREAST LUMPECTOMY WITH RADIOACTIVE SEED AND RIGHT COMPLETE AXILLARY LYMPH NODE DISSECTION ERAS PATHWAY  Admission Condition: good  Discharged Condition: good  Indication for Admission: This is a 51 year old female who returns following neoadjuvant chemotherapy to plan definitive right breast surgery for her breast cancer.  Dr. Burney Gauze and Dr. Sondra Come ar involved in her care.  she is healthy but noticed a lump in her right axilla and went for evaluation. She had not had a mammogram in 7 years. Imaging studies and Rondall Allegra at [facility] included a mammogram which showed a spiculated right breast mass in the deep medial quadrant of the right breast and multiple axillary lymph nodes. By ultrasound the right axillary lymph node was 5.7 cm. 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 MRI on April 30, 2016 confirmed a 3.7 cm mass in the medial right breast with architectural distortion and also showed a large heterogeneous right axillary mass injuring 6.9 cm. Dr. Servando Snare placed a Port-A-Cath. 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 CT of chest on September 19, 2016 showed a solitary will be enlarged right axillary node. No intrathoracic adenopathy.  Tiny 3 mm subpleural left lower lobe pulmonary nodule. Left humeral metaphysis this showed a sclerotic lesion incompletely evaluated. Thought not to be cancer. She has transferred her care here because she lives in [place] her husband works in [place]. Dr. Marin Olp evaluated her and referred her to me. When I saw her she was thinking she needed a mastectomy but she may be a candidate for breast conservation surgery and so she now thinks she wants to do.  Her into treatment MRI showed decrease in the size of the axillary lymph nodes and significant decrease in the size of the breast tumor.  She is a candidate for lumpectomy. Family history reveals maternal grandmother had breast cancer and died of metastatic disease. Mother died of melanoma. Father died of kidney disease and CHF.n. Dr. Rosiland Oz request that The Port-A-Cath will be left in. I discussed the indications, details, techniques, and numerous risk of the surgery with the patient and her husband.  She agrees with this plan.  Hospital Course: on the day of admission the patient was taken to the operating room and underwent a right breast lumpectomy with radioactive seed localization and complete right axillary lymph node dissection.  Operative findings were the original titanium marker clip and the radioactive seed were very close to one another in the central breast medially at the 3:00 position.  Because of the original size of the tumor, I did a generous lumpectomy, at least 7 cm in diameter and the specimen mammogram looked very good with the marker clip and seed  in the center of the specimen.  There were matted palpable right axillary lymph nodes.  I did a complete level I and level II right axillary dissection.    The patient was admitted overnight to observe for bleeding and pain control.  She did very well.  On postop day 1 she was ambulatory, voiding, pain was well controlled.  She wanted to go home.  The lumpectomy  incision in the axillary incision were clean dry soft and flat without hematoma.  The drainage was serosanguineous and of relatively low volume.  She was advised in diet and activities and shoulder range of motion.  She was given a prescription for Norco for pain.  She was educated regarding drain management.  I will see her back in the office in one week  Consults: None  Significant Diagnostic Studies: surgical pathology, pending  Treatments: surgery,right breast lumpectomy and right axillary lymph node dissection  Disposition: Home  Patient Instructions:  Allergies as of 10/18/2016   No Known Allergies     Medication List    TAKE these medications   HYDROcodone-acetaminophen 5-325 MG tablet Commonly known as:  NORCO/VICODIN Take 1-2 tablets by mouth every 4 (four) hours as needed for moderate pain.   valACYclovir 1000 MG tablet Commonly known as:  VALTREX Take 1,000 mg by mouth daily as needed.            Discharge Care Instructions        Start     Ordered   10/18/16 0000  HYDROcodone-acetaminophen (NORCO/VICODIN) 5-325 MG tablet  Every 4 hours PRN     10/18/16 0538   10/18/16 0000  Increase activity slowly     10/18/16 0538   10/18/16 0000  Diet - low sodium heart healthy     10/18/16 0538   10/18/16 0000  Discharge instructions    Comments:  [facility] LAPAROSCOPIC SURGERY: POST OP INSTRUCTIONS Always review your discharge instruction sheet given to you by the facility where your surgery was performed. IF YOU HAVE DISABILITY OR FAMILY LEAVE FORMS, YOU MUST BRING THEM TO THE OFFICE FOR PROCESSING.   DO NOT GIVE THEM TO YOUR DOCTOR.  A prescription for pain medication may be given to you upon discharge.  Take your pain medication as prescribed, if needed.  If narcotic pain medicine is not needed, then you may take acetaminophen (Tylenol) or ibuprofen (Advil) as needed. Take your usually prescribed medications unless otherwise  directed. If you need a refill on your pain medication, please contact your pharmacy.  They will contact our office to request authorization. Prescriptions will not be filled after 5pm or on week-ends. You should follow a light diet the first few days after arrival home, such as soup and crackers, etc.  Be sure to include lots of fluids daily. Most patients will experience some swelling and bruising in the area of the incisions.  Ice packs will help.  Swelling and bruising can take several days to resolve.  It is common to experience some constipation if taking pain medication after surgery.  Increasing fluid intake and taking a stool softener (such as Colace) will usually help or prevent this problem from occurring.  A mild laxative (Milk of Magnesia or Miralax) should be taken according to package instructions if there are no bowel movements after 48 hours. Unless discharge instructions indicate otherwise, you may remove your bandages 24-48 hours after surgery, and you may shower at that time.  You may have steri-strips (small skin  tapes) in place directly over the incision.  These strips should be left on the skin for 7-10 days.  If your surgeon used skin glue on the incision, you may shower in 24 hours.  The glue will flake off over the next 2-3 weeks.  Any sutures or staples will be removed at the office during your follow-up visit. ACTIVITIES:  You may resume regular (light) daily activities beginning the next day-such as daily self-care, walking, climbing stairs-gradually increasing activities as tolerated.  You may have sexual intercourse when it is comfortable.  Refrain from any heavy lifting or straining until approved by your doctor. You may drive when you are no longer taking prescription pain medication, you can comfortably wear a seatbelt, and you can safely maneuver your car and apply brakes. RETURN TO WORK:  __________________________________________________________ Dennis Bast should see your  doctor in the office for a follow-up appointment approximately 2-3 weeks after your surgery.  Make sure that you call for this appointment within a day or two after you arrive home to insure a convenient appointment time. OTHER INSTRUCTIONS: __________________________________________________________________________________________________________________________ __________________________________________________________________________________________________________________________ WHEN TO CALL YOUR DOCTOR: Fever over 101.0 Inability to urinate Continued bleeding from incision. Increased pain, redness, or drainage from the incision. Increasing abdominal pain  The clinic staff is available to answer your questions during regular business hours.  Please don't hesitate to call and ask to speak to one of the nurses for clinical concerns.  If you have a medical emergency, go to the nearest emergency room or call 911.  A surgeon from [facility] is always on call at the hospital. [address] [place], [place], [place]  [address] ? [address], [place], [place]   [address] [phone] ? [phone] ? FAX [phone] Web site: [email or web address]   10/18/16 0538   10/18/16 0000  May shower / [place]     10/18/16 0538   10/18/16 0000  May walk up steps     10/18/16 0538   10/18/16 0000  Driving Restrictions    Comments:  2-4 days   10/18/16 0538   10/18/16 0000  Lifting restrictions    Comments:  20 lbs   10/18/16 0538   10/18/16 0000  No wound care     10/18/16 0538   10/18/16 0000  Call MD for:  persistant dizziness or light-headedness     10/18/16 0538   10/18/16 0000  Call MD for:  hives     10/18/16 0538   10/18/16 0000  Call MD for:  difficulty breathing, headache or visual disturbances     10/18/16 0538   10/18/16 0000  Call MD for:  redness, tenderness, or signs of infection (pain, swelling, redness, odor or green/yellow discharge around incision  site)     10/18/16 0538   10/18/16 0000  Call MD for:  severe uncontrolled pain     10/18/16 0538   10/18/16 0000  Call MD for:  persistant nausea and vomiting     10/18/16 0538   10/18/16 0000  Call MD for:  temperature >100.4     10/18/16 0538      Activity: unlimited ambulation.  May shower starting Saturday.  Move your shoulder around as instructed keep a written record of the drainage Diet: low fat, low cholesterol diet Wound Care: as directed  Follow-up:  With Dr. Dalbert Batman in 1 week.  Signed: Edsel Petrin. Dalbert Batman, M.D., FACS General and minimally invasive surgery Breast and Colorectal Surgery   Addendum: I logged onto the  [facility] website and reviewed her prescription medication history  10/18/2016, 5:42 AM

## 2016-10-18 NOTE — Discharge Instructions (Signed)
[facility] °Office Phone Number [phone] ° °BREAST BIOPSY/ PARTIAL MASTECTOMY: POST OP INSTRUCTIONS ° °Always review your discharge instruction sheet given to you by the facility where your surgery was performed. ° °IF YOU HAVE DISABILITY OR FAMILY LEAVE FORMS, YOU MUST BRING THEM TO THE OFFICE FOR PROCESSING.  DO NOT GIVE THEM TO YOUR DOCTOR. ° °1. A prescription for pain medication may be given to you upon discharge.  Take your pain medication as prescribed, if needed.  If narcotic pain medicine is not needed, then you may take acetaminophen (Tylenol) or ibuprofen (Advil) as needed. °2. Take your usually prescribed medications unless otherwise directed °3. If you need a refill on your pain medication, please contact your pharmacy.  They will contact our office to request authorization.  Prescriptions will not be filled after 5pm or on week-ends. °4. You should eat very light the first 24 hours after surgery, such as soup, crackers, pudding, etc.  Resume your normal diet the day after surgery. °5. Most patients will experience some swelling and bruising in the breast.  Ice packs and a good support bra will help.  Swelling and bruising can take several days to resolve.  °6. It is common to experience some constipation if taking pain medication after surgery.  Increasing fluid intake and taking a stool softener will usually help or prevent this problem from occurring.  A mild laxative (Milk of Magnesia or Miralax) should be taken according to package directions if there are no bowel movements after 48 hours. °7. Unless discharge instructions indicate otherwise, you may remove your bandages 24-48 hours after surgery, and you may shower at that time.  You may have steri-strips (small skin tapes) in place directly over the incision.  These strips should be left on the skin for 7-10 days.  If your surgeon used skin glue on the incision, you may shower in 24 hours.  The glue will flake off over the  next 2-3 weeks.  Any sutures or staples will be removed at the office during your follow-up visit. °8. ACTIVITIES:  You may resume regular daily activities (gradually increasing) beginning the next day.  Wearing a good support bra or sports bra minimizes pain and swelling.  You may have sexual intercourse when it is comfortable. °a. You may drive when you no longer are taking prescription pain medication, you can comfortably wear a seatbelt, and you can safely maneuver your car and apply brakes. °b. RETURN TO WORK:  ______________________________________________________________________________________ °9. You should see your doctor in the office for a follow-up appointment approximately two weeks after your surgery.  Your doctor’s nurse will typically make your follow-up appointment when she calls you with your pathology report.  Expect your pathology report 2-3 business days after your surgery.  You may call to check if you do not hear from us after three days. °10. OTHER INSTRUCTIONS: _______________________________________________________________________________________________ _____________________________________________________________________________________________________________________________________ °_____________________________________________________________________________________________________________________________________ °_____________________________________________________________________________________________________________________________________ ° °WHEN TO CALL YOUR DOCTOR: °1. Fever over 101.0 °2. Nausea and/or vomiting. °3. Extreme swelling or bruising. °4. Continued bleeding from incision. °5. Increased pain, redness, or drainage from the incision. ° °The clinic staff is available to answer your questions during regular business hours.  Please don’t hesitate to call and ask to speak to one of the nurses for clinical concerns.  If you have a medical emergency, go to the nearest  emergency room or call 911.  A surgeon from [facility] is always on call at the hospital. ° °For further questions, please visit [email or web address]  °

## 2016-10-18 NOTE — Progress Notes (Signed)
DC with husband at bedside, both verbally understood DC instructions and how to care for drain. Supplies sent home with pt.Christina Joseph

## 2016-11-01 ENCOUNTER — Ambulatory Visit (HOSPITAL_BASED_OUTPATIENT_CLINIC_OR_DEPARTMENT_OTHER): Payer: BC Managed Care – PPO | Admitting: Hematology & Oncology

## 2016-11-01 ENCOUNTER — Other Ambulatory Visit (HOSPITAL_BASED_OUTPATIENT_CLINIC_OR_DEPARTMENT_OTHER): Payer: BC Managed Care – PPO

## 2016-11-01 ENCOUNTER — Ambulatory Visit: Payer: BC Managed Care – PPO

## 2016-11-01 VITALS — BP 152/88 | HR 93 | Temp 97.7°F | Resp 18 | Wt 161.8 lb

## 2016-11-01 DIAGNOSIS — R5383 Other fatigue: Secondary | ICD-10-CM | POA: Diagnosis not present

## 2016-11-01 DIAGNOSIS — C50911 Malignant neoplasm of unspecified site of right female breast: Secondary | ICD-10-CM

## 2016-11-01 DIAGNOSIS — Z170 Estrogen receptor positive status [ER+]: Principal | ICD-10-CM

## 2016-11-01 DIAGNOSIS — C773 Secondary and unspecified malignant neoplasm of axilla and upper limb lymph nodes: Secondary | ICD-10-CM

## 2016-11-01 DIAGNOSIS — C50211 Malignant neoplasm of upper-inner quadrant of right female breast: Secondary | ICD-10-CM

## 2016-11-01 LAB — CBC WITH DIFFERENTIAL (CANCER CENTER ONLY)
BASO#: 0 10*3/uL (ref 0.0–0.2)
BASO%: 0.4 % (ref 0.0–2.0)
EOS%: 1.7 % (ref 0.0–7.0)
Eosinophils Absolute: 0.1 10*3/uL (ref 0.0–0.5)
HEMATOCRIT: 33.9 % — AB (ref 34.8–46.6)
HEMOGLOBIN: 10.9 g/dL — AB (ref 11.6–15.9)
LYMPH#: 1.1 10*3/uL (ref 0.9–3.3)
LYMPH%: 23.7 % (ref 14.0–48.0)
MCH: 27.9 pg (ref 26.0–34.0)
MCHC: 32.2 g/dL (ref 32.0–36.0)
MCV: 87 fL (ref 81–101)
MONO#: 0.3 10*3/uL (ref 0.1–0.9)
MONO%: 5.6 % (ref 0.0–13.0)
NEUT%: 68.6 % (ref 39.6–80.0)
NEUTROS ABS: 3.2 10*3/uL (ref 1.5–6.5)
Platelets: 249 10*3/uL (ref 145–400)
RBC: 3.91 10*6/uL (ref 3.70–5.32)
RDW: 14.2 % (ref 11.1–15.7)
WBC: 4.7 10*3/uL (ref 3.9–10.0)

## 2016-11-01 LAB — CMP (CANCER CENTER ONLY)
ALBUMIN: 3.5 g/dL (ref 3.3–5.5)
ALK PHOS: 51 U/L (ref 26–84)
ALT: 24 U/L (ref 10–47)
AST: 25 U/L (ref 11–38)
BILIRUBIN TOTAL: 0.6 mg/dL (ref 0.20–1.60)
BUN, Bld: 5 mg/dL — ABNORMAL LOW (ref 7–22)
CALCIUM: 9.6 mg/dL (ref 8.0–10.3)
CO2: 29 mEq/L (ref 18–33)
Chloride: 108 mEq/L (ref 98–108)
Creat: 0.7 mg/dl (ref 0.6–1.2)
GLUCOSE: 104 mg/dL (ref 73–118)
Potassium: 3.5 mEq/L (ref 3.3–4.7)
Sodium: 147 mEq/L — ABNORMAL HIGH (ref 128–145)
TOTAL PROTEIN: 6.9 g/dL (ref 6.4–8.1)

## 2016-11-01 MED ORDER — HEPARIN SOD (PORK) LOCK FLUSH 100 UNIT/ML IV SOLN
500.0000 [IU] | Freq: Once | INTRAVENOUS | Status: AC
  Administered 2016-11-01: 500 [IU] via INTRAVENOUS
  Filled 2016-11-01: qty 5

## 2016-11-01 MED ORDER — SODIUM CHLORIDE 0.9 % IJ SOLN
10.0000 mL | Freq: Once | INTRAMUSCULAR | Status: AC
  Administered 2016-11-01: 10 mL
  Filled 2016-11-01: qty 10

## 2016-11-01 NOTE — Patient Instructions (Signed)
Implanted Port Home Guide An implanted port is a type of central line that is placed under the skin. Central lines are used to provide IV access when treatment or nutrition needs to be given through a person's veins. Implanted ports are used for long-term IV access. An implanted port may be placed because:  You need IV medicine that would be irritating to the small veins in your hands or arms.  You need long-term IV medicines, such as antibiotics.  You need IV nutrition for a long period.  You need frequent blood draws for lab tests.  You need dialysis.  Implanted ports are usually placed in the chest area, but they can also be placed in the upper arm, the abdomen, or the leg. An implanted port has two main parts:  Reservoir. The reservoir is round and will appear as a small, raised area under your skin. The reservoir is the part where a needle is inserted to give medicines or draw blood.  Catheter. The catheter is a thin, flexible tube that extends from the reservoir. The catheter is placed into a large vein. Medicine that is inserted into the reservoir goes into the catheter and then into the vein.  How will I care for my incision site? Do not get the incision site wet. Bathe or shower as directed by your health care provider. How is my port accessed? Special steps must be taken to access the port:  Before the port is accessed, a numbing cream can be placed on the skin. This helps numb the skin over the port site.  Your health care provider uses a sterile technique to access the port. ? Your health care provider must put on a mask and sterile gloves. ? The skin over your port is cleaned carefully with an antiseptic and allowed to dry. ? The port is gently pinched between sterile gloves, and a needle is inserted into the port.  Only "non-coring" port needles should be used to access the port. Once the port is accessed, a blood return should be checked. This helps ensure that the port  is in the vein and is not clogged.  If your port needs to remain accessed for a constant infusion, a clear (transparent) bandage will be placed over the needle site. The bandage and needle will need to be changed every week, or as directed by your health care provider.  Keep the bandage covering the needle clean and dry. Do not get it wet. Follow your health care provider's instructions on how to take a shower or bath while the port is accessed.  If your port does not need to stay accessed, no bandage is needed over the port.  What is flushing? Flushing helps keep the port from getting clogged. Follow your health care provider's instructions on how and when to flush the port. Ports are usually flushed with saline solution or a medicine called heparin. The need for flushing will depend on how the port is used.  If the port is used for intermittent medicines or blood draws, the port will need to be flushed: ? After medicines have been given. ? After blood has been drawn. ? As part of routine maintenance.  If a constant infusion is running, the port may not need to be flushed.  How long will my port stay implanted? The port can stay in for as long as your health care provider thinks it is needed. When it is time for the port to come out, surgery will be   done to remove it. The procedure is similar to the one performed when the port was put in. When should I seek immediate medical care? When you have an implanted port, you should seek immediate medical care if:  You notice a bad smell coming from the incision site.  You have swelling, redness, or drainage at the incision site.  You have more swelling or pain at the port site or the surrounding area.  You have a fever that is not controlled with medicine.  This information is not intended to replace advice given to you by your health care provider. Make sure you discuss any questions you have with your health care provider. Document  Released: 01/08/2005 Document Revised: 06/16/2015 Document Reviewed: 09/15/2012 Elsevier Interactive Patient Education  2017 [employer]  

## 2016-11-02 ENCOUNTER — Telehealth: Payer: Self-pay | Admitting: *Deleted

## 2016-11-02 LAB — FOLLICLE STIMULATING HORMONE: FSH: 75.8 m[IU]/mL

## 2016-11-02 LAB — LUTEINIZING HORMONE: LH: 31.3 m[IU]/mL

## 2016-11-02 NOTE — Telephone Encounter (Addendum)
Patient is aware of results  ----- Message from [name], MD sent at 11/02/2016  6:58 AM EDT ----- Call - you are definitely post menopausal!! pete

## 2016-11-05 NOTE — Progress Notes (Signed)
Hematology and Oncology Follow Up Visit  Christina Joseph [id number] 30-Sep-1965 51 y.o. 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 We first saw Christina Joseph in August. At that time, Christina Joseph had gotten all of Christina Joseph treatment in [place]. Christina Joseph had Christina Joseph chemotherapy. Christina Joseph received neoadjuvant chemotherapy with AC-T. Christina Joseph had 4 cycles of AC followed by 9 cycles of Taxol.  Christina Joseph has moved locally. Christina Joseph wants Christina Joseph treatment locally.  We were able to get Christina Joseph to see Dr. Dalbert Batman. He went ahead and did a right lumpectomy on September 26. The pathology report ([id number]) showed invasive ductal carcinoma that measured 1.9 cm. All margins were negative. Christina Joseph had 3/10 positive lymph nodes.  Christina Joseph still has a JP drain in the right axilla.  I spoke with Dr. Sondra Come of radiation oncology. He will see the patient for adjuvant radiation therapy.  Christina Joseph is feeling quite well. Christina Joseph does have a little bit of fatigue.  Christina Joseph has had no fever. Christina Joseph's had no nausea or vomiting. Christina Joseph's had no change in bowel or bladder habits.  Christina Joseph is clearly postmenopausal. Christina Joseph estradiol level in August was less than 2.5. Today, Christina Joseph LH was 31 and Christina Joseph Palacios was 76.  Christina Joseph has had no leg swelling. Christina Joseph's had no rashes.  Overall, Christina Joseph performance status is ECOG 1.  Medications:  Current Outpatient Prescriptions:  .  HYDROcodone-acetaminophen (NORCO/VICODIN) 5-325 MG tablet, Take 1-2 tablets by mouth every 4 (four) hours as needed for moderate pain., Disp: 30 tablet, Rfl: 0 .  valACYclovir (VALTREX) 1000 MG tablet, Take 1,000 mg by mouth daily as needed. , Disp: , Rfl:   Allergies: No Known Allergies  Past Medical History, Surgical history, Social history, and Family History  were reviewed and updated.  Review of Systems: Review of Systems - Oncology  Physical Exam:  vitals were not taken for this visit.  Wt Readings from Last 3 Encounters:  11/01/16 161 lb 12.8 oz (73.4 kg)  10/17/16 160 lb 11.5 oz (72.9 kg)  10/12/16 160 lb (72.6 kg)    Physical Exam   Well-developed and well-nourished white female in no obvious distress. Vital signs show a temperature of 98.7. Pulse 100. Blood pressure 130/82. Weight is 161 pounds. Head and neck exam shows no ocular or oral lesions. There are no palpable cervical or supraclavicular lymph nodes. Lungs are clear bilaterally. Cardiac exam regular rate and rhythm with no murmurs, rubs or bruits. Breast exam shows left breast no masses, edema or erythema. There is no left axillary adenopathy. Right breast is somewhat contracted from  surgery. Christina Joseph has the healing lumpectomy scar at a 3:00 position. This is healing. There is some slight erythema at the lumpectomy site. No distinct masses noted in the right breast. The lymphadenectomy scar is healing in the right axilla. Abdomen is soft. Christina Joseph has good bowel sounds. There is no fluid wave. There is no palpable liver or spleen tip. Back exam shows no tenderness over the spine, ribs or hips. Extremities shows no clubbing, cyanosis or edema. There is no lymphedema of the right arm. Neurological exam shows no focal neurological deficits.  Lab Results  Component Value Date   WBC 4.7 11/01/2016   HGB 10.9 (L) 11/01/2016  HCT 33.9 (L) 11/01/2016   MCV 87 11/01/2016   PLT 249 11/01/2016     Chemistry      Component Value Date/Time   NA 147 (H) 11/01/2016 1051   K 3.5 11/01/2016 1051   CL 108 11/01/2016 1051   CO2 29 11/01/2016 1051   BUN 5 (L) 11/01/2016 1051   CREATININE 0.7 11/01/2016 1051      Component Value Date/Time   CALCIUM 9.6 11/01/2016 1051   ALKPHOS 51 11/01/2016 1051   AST 25 11/01/2016 1051   ALT 24 11/01/2016 1051   BILITOT 0.60 11/01/2016 1051          Impression and Plan: Ms. Perl is a 51 year old postmenopausal white female. Christina Joseph has a stage IIIA invasive ductal carcinoma of the right breast. This tumor is ER positive and Christina Joseph-2 negative.  Christina Joseph clearly is at significant risk for recurrence given that Christina Joseph had 3 positive lymph nodes post neoadjuvant chemotherapy.  Christina Joseph definitely will benefit from adjuvant radiation therapy.  Christina Joseph will definitely benefit from adjuvant antiestrogen therapy. I spoke with Dr. Sondra Come of radiation oncology. He says not to start the antiestrogen therapy yet.  I probably will put Christina Joseph on Femara.  I also think Christina Joseph probably would benefit from Prolia. Christina Joseph is postmenopausal. Christina Joseph has estrogen positive breast cancer.  I spent about 45 minutes with Christina Joseph. Christina Joseph is very grateful for the wonderful care that Christina Joseph got from Dr. Dalbert Batman.  We will plan to get Christina Joseph back to see [place] in another 5 weeks.  Answered all of Christina Joseph questions. I reviewed the pathology report with Christina Joseph.   [name], MD 10/15/20185:34 PM

## 2016-11-06 LAB — ESTRADIOL, ULTRA SENS: ESTRADIOL, SENSITIVE: 3.1 pg/mL

## 2016-11-09 ENCOUNTER — Encounter: Payer: Self-pay | Admitting: Radiation Oncology

## 2016-11-09 NOTE — Progress Notes (Signed)
Location of Breast Cancer: infiltrating ductal carcinoma of the right breast   Histology per Pathology Report:   10/17/16 Diagnosis 1. Breast, lumpectomy, Right - INVASIVE DUCTAL CARCINOMA WITH PAPILLARY FEATURES, NOTTINGHAM GRADE 2 OF 3, 1.9 CM - MARGINS UNINVOLVED BY CARCINOMA (0.4 CM LATERAL MARGIN) - SEE ONCOLOGY TABLE BELOW 2. Lymph nodes, regional resection, Right Axillary Contents - METASTATIC CARCINOMA INVOLVING THREE OF TEN LYMPH NODES (3/10)  04/09/16 Breast, right (medial), needle core biopsy:             Invasive ductal carcinoma, not otherwise specified.             See comment.   Diagnosis Comment Microscopic sections revealed evidence of a ductal carcinoma. Many of the tumor cells were dislodged or disassociated from the adjacent stroma. Some features of a carcinoma with papillary features were present. The pancytokeratin stain showed changes consistent with an invasive ductal carcinoma. The tumor cells were also strongly positive for E-cadherin. Immunoperoxidase stains for myosin and calponin failed to show any appreciable staining around tumor islands. Material will be submitted for the breast prognostic panel.     Receptor Satus: lumpectomy ER 100%, PR 50%, Ki-67 2% Receptor Status: ER(95%), PR (95%), Her2-neu (borderline), Ki-()  Did patient present with symptoms (if so, please note symptoms) or was this found on screening mammography?: enlarging mass in her right axillary area over the course of the last 2 months  Past/Anticipated interventions by surgeon, if any: breast biopsy 04/09/16, 10/17/16 - Procedure: RIGHT BREAST LUMPECTOMY WITH RADIOACTIVE SEED AND RIGHT COMPLETE AXILLARY [place];  Surgeon: Fanny Skates, MD  Past/Anticipated interventions by medical oncology, if any:  She was given neoadjuvant chemotherapy with AC-T.  she had 4 cycles of Adria/Cytoxan followed by 9 cycles of Taxol. She stopped the Taxol because of cutaneous  toxicity.  She finished 08/30/16.  She had chemo at [facility] in [place].  Femara to follow radiation.  Lymphedema issues, if any:  no   Pain issues, if any: no  SAFETY ISSUES:  Prior radiation? no  Pacemaker/ICD? no  Possible current pregnancy?no  Is the patient on methotrexate? no  Current Complaints / other details:  Patient has a family history of breath cancer in her paternal grandmother.  She recently moved from [facility].    BP (!) 128/94 (BP Location: Left Arm, Patient Position: Sitting)   Pulse 90   Temp 98.9 F (37.2 C) (Oral)   Ht '5\' 4"'$  (1.626 m)   Wt 162 lb 6.4 oz (73.7 kg)   SpO2 100%   BMI 27.88 kg/m    Wt Readings from Last 3 Encounters:  11/14/16 162 lb 6.4 oz (73.7 kg)  11/01/16 161 lb 12.8 oz (73.4 kg)  10/17/16 160 lb 11.5 oz (72.9 kg)

## 2016-11-13 NOTE — Progress Notes (Signed)
Radiation Oncology         [phone] ________________________________  Name: Christina Joseph MRN: [id number]  Date: 11/14/2016  DOB: 11-16-1965  Re-consultation Visit Note  CC: Drue Flirt, MD  [name], MD    ICD-10-CM   1. Stage III infiltrating ductal breast carcinoma, ER+, right ([place]) C50.911    Z17.0   2. Malignant neoplasm of upper-inner quadrant of right breast in female, estrogen receptor positive ([place]) C50.211    Z17.0     Diagnosis:  51 year-old woman with Stage IIIA (T2N2M0) ER+/PR+/HER2- infiltrating ductal carcinoma of the right breast.  Pathologic stage IIIA (ypT1c, ypN1a) 1.9 cm  invasive ductal carcinoma of the right breast, ER(95%), PR(50%), HER2(+) with ratio of 2.32 of HER2/Cep17 signals, Ki67 2%  Narrative:  The patient returns today for routine follow-up. Since we initially saw the patient in our multidisciplinary breast clinic she underwent lumpectomy of the right breast performed by Dr Dalbert Batman on 10/17/16.  Surgical pathology from her procedure is notable as in the above diagnosis. Additionally, 10 lymph nodes were surveyed from her surgical dissection, three of which were positive for involving metastatic carcinoma. Surgical margins were negative.   She has been recovering from her surgery well. Her scars continue to heal well and she denies any overt pain, redness, or other complications within the site of her surgery. She is otherwise in good spirits and without acute complaints.                   ALLERGIES:  has No Known Allergies.  Meds: Current Outpatient Prescriptions  Medication Sig Dispense Refill  . HYDROcodone-acetaminophen (NORCO/VICODIN) 5-325 MG tablet Take 1-2 tablets by mouth every 4 (four) hours as needed for moderate pain. 30 tablet 0  . valACYclovir (VALTREX) 1000 MG tablet Take 1,000 mg by mouth daily as needed.      No current facility-administered medications for this encounter.     Physical Findings: The patient  is in no acute distress. Patient is alert and oriented.  height is _0  (1.626 m) and weight is 162 lb 6.4 oz (73.7 kg). Her oral temperature is 98.9 F (37.2 C). Her blood pressure is 128/94 (abnormal) and her pulse is 90. Her oxygen saturation is 100%. .  No significant changes. Lungs are clear to auscultation bilaterally. Heart has regular rate and rhythm. No palpable cervical, supraclavicular, or axillary adenopathy. Abdomen soft, non-tender, normal bowel sounds. Breast: Left breast no palpable mass or nipple discharge. Pt has a port-o-cath in place int he left upper breast. Right breast the pt has a well healing scar in the UIQ, without signs of drainage or infection. She has a separate scar in the axillary region, without signs of drainage or infection. No dominate mass appreciated in the breast, nipple discharge, or bleeding. She has good ROM in the right arm and shoulder. She has not appreciable lymphedema.   Lab Findings: Lab Results  Component Value Date   WBC 4.7 11/01/2016   HGB 10.9 (L) 11/01/2016   HCT 33.9 (L) 11/01/2016   MCV 87 11/01/2016   PLT 249 11/01/2016    Radiographic Findings: Mm Breast Surgical Specimen  Result Date: 10/17/2016 CLINICAL DATA:  Status post radioactive seed localization and right lumpectomy for right breast carcinoma. EXAM: SPECIMEN RADIOGRAPH OF THE RIGHT BREAST COMPARISON:  Previous exam(s). FINDINGS: Status post excision of the right breast. The radioactive seed and twist shaped biopsy marker clip are present, completely intact, and were marked for pathology.  IMPRESSION: Specimen radiograph of the right breast. Electronically Signed   By: Nolon Nations M.D.   On: 10/17/2016 09:20   Mm Rt Radioactive Seed Loc Mammo Guide  Result Date: 10/16/2016 CLINICAL DATA:  Patient for preoperative localization prior to right breast lumpectomy. EXAM: MAMMOGRAPHIC GUIDED RADIOACTIVE SEED LOCALIZATION OF THE RIGHT BREAST COMPARISON:  Previous exam(s). FINDINGS:  Patient presents for radioactive seed localization prior to right breast lumpectomy. I met with the patient and we discussed the procedure of seed localization including benefits and alternatives. We discussed the high likelihood of a successful procedure. We discussed the risks of the procedure including infection, bleeding, tissue injury and further surgery. We discussed the low dose of radioactivity involved in the procedure. Informed, written consent was given. The usual time-out protocol was performed immediately prior to the procedure. Using mammographic guidance, sterile technique, 1% lidocaine and an I-125 radioactive seed, spiculated mass within the medial right breast was localized using a medial approach. The follow-up mammogram images confirm the seed in the expected location and were marked for Dr. Dalbert Batman. Note the biopsy marking clip is located 1 cm anterior and 1 cm inferior to the mass/radioactive seed. Follow-up survey of the patient confirms presence of the radioactive seed. Order number of I-125 seed:  [id number]. Total activity:  1.281 millicuries  Reference Date: 10/15/2016 The patient tolerated the procedure well and was released from the [place]. She was given instructions regarding seed removal. IMPRESSION: Note that the biopsy marking clip is located approximately 1 cm anterior and 1 cm inferior to the radioactive seed which is placed within the spiculated right breast mass. Radioactive seed localization right breast. No apparent complications. Electronically Signed   By: Lovey Newcomer M.D.   On: 10/16/2016 14:44    Impression: Christina Joseph is a 51 y.o. woman who presents today to discuss the role of radiotherapy in the ongoing management of her pathologic stage IIIA (ypT1c, ypN1a) 1.9 cm metastatic invasive ductal carcinoma of the right breast, ER(95%), PR(50%), HER2(+) with ratio of 2.32 of HER2/Cep17 signals, Ki67 2%.  The pt was able to proceed with breast conservation  surgery. Given the extent of axillary involvement prior to her neoadjuvant chemotherapy, the pt did proceed with a full axillary dissection she was found to have 3/10 nodes which were positive for the involvement of carcinoma. The residual primary site measured 1.9cm. The surgical margins were clear, but the lateral margin was somewhat close at 20m. The pt would be a good candidate for adjuvant radiation therapy with coverage to the right  Breast, but I also recommend coverage of the high axilla and supraclavicular region at the same time as her breast radiation treatments, given the significant nodal involvement at the time of her axillar dissection. The risks vs benefits, side effects, and potential toxicities were discussed at great length with the patient. I informed them that they may experience possible fatigue and radiation-related skin changes within their treatment field. The patient appears to understand and wishes to proceed with planned course of treatment.   Plan:  CT Simulation Oct 29th at 9am. She will begin treatments approximately 1 week later once she is 6 weeks postop.  ____________________________________ -----------------------------------  JBlair Promise PhD, MD  This document serves as a record of services personally performed by JGery Pray MD. It was created on his behalf by WReola Mosher a trained medical scribe. The creation of this record is based on the scribe's personal observations and the provider's statements to them.  This document has been checked and approved by the attending provider.

## 2016-11-14 ENCOUNTER — Ambulatory Visit
Admission: RE | Admit: 2016-11-14 | Discharge: 2016-11-14 | Disposition: A | Discharge status: Home / Self Care | Payer: BC Managed Care – PPO | Source: Ambulatory Visit | Attending: Radiation Oncology | Admitting: Radiation Oncology

## 2016-11-14 ENCOUNTER — Encounter: Payer: Self-pay | Admitting: Radiation Oncology

## 2016-11-14 VITALS — BP 128/94 | HR 90 | Temp 98.9°F | Ht 64.0 in | Wt 162.4 lb

## 2016-11-14 DIAGNOSIS — C50211 Malignant neoplasm of upper-inner quadrant of right female breast: Secondary | ICD-10-CM

## 2016-11-14 DIAGNOSIS — Z170 Estrogen receptor positive status [ER+]: Principal | ICD-10-CM

## 2016-11-14 DIAGNOSIS — C50911 Malignant neoplasm of unspecified site of right female breast: Secondary | ICD-10-CM

## 2016-11-14 DIAGNOSIS — Z510 Encounter for antineoplastic radiation therapy: Secondary | ICD-10-CM | POA: Diagnosis not present

## 2016-11-19 ENCOUNTER — Ambulatory Visit
Admission: RE | Admit: 2016-11-19 | Discharge: 2016-11-19 | Disposition: A | Discharge status: Home / Self Care | Payer: BC Managed Care – PPO | Source: Ambulatory Visit | Attending: Radiation Oncology | Admitting: Radiation Oncology

## 2016-11-19 DIAGNOSIS — Z170 Estrogen receptor positive status [ER+]: Principal | ICD-10-CM

## 2016-11-19 DIAGNOSIS — Z510 Encounter for antineoplastic radiation therapy: Secondary | ICD-10-CM | POA: Diagnosis not present

## 2016-11-19 DIAGNOSIS — C50211 Malignant neoplasm of upper-inner quadrant of right female breast: Secondary | ICD-10-CM

## 2016-11-20 ENCOUNTER — Encounter: Payer: Self-pay | Admitting: *Deleted

## 2016-11-20 NOTE — Progress Notes (Signed)
  Radiation Oncology         [phone] ________________________________  Name: Christina Joseph MRN: [id number]  Date: 11/19/2016  DOB: [birth year]  SIMULATION AND TREATMENT PLANNING NOTE    ICD-10-CM   1. Malignant neoplasm of upper-inner quadrant of right breast in female, estrogen receptor positive ([place]) C50.211    Z17.0     DIAGNOSIS:  Pathologic stage IIIA (ypT1c, ypN1a) 1.9 cm  invasive ductal carcinoma of the right breast, ER(95%), PR(50%), HER2(+) with ratio of 2.32 of HER2/Cep17 signals, Ki67 2%  NARRATIVE:  The patient was brought to the [place] [facility].  Identity was confirmed.  All relevant records and images related to the planned course of therapy were reviewed.  The patient freely provided informed written consent to proceed with treatment after reviewing the details related to the planned course of therapy. The consent form was witnessed and verified by the simulation staff.  Then, the patient was set-up in a stable reproducible  supine position for radiation therapy.  CT images were obtained.  Surface markings were placed.  The CT images were loaded into the planning software.  Then the target and avoidance structures were contoured.  Treatment planning then occurred.  The radiation prescription was entered and confirmed.  Then, I designed and supervised the construction of a total of 4 medically necessary complex treatment devices.  I have requested : 3D Simulation  I have requested a DVH of the following structures: heart, lungs, lumpectomy cavity.  I have ordered:CBC  PLAN:  The right breast will receive 50.4 Gy in 28 fractions followed by a boost to the lumpectomy cavity of 10 gray for a cumulative dose of 60.4 gray. The high axilla/supraclavicular region will receive 45 gray in 25 fractions.  -----------------------------------  Blair Promise, PhD, MD

## 2016-11-20 NOTE — Progress Notes (Signed)
On 11-20-16 got fmla paper from the pt .gave to the nurse

## 2016-11-21 ENCOUNTER — Ambulatory Visit (HOSPITAL_BASED_OUTPATIENT_CLINIC_OR_DEPARTMENT_OTHER): Payer: BC Managed Care – PPO | Admitting: Hematology & Oncology

## 2016-11-21 VITALS — BP 149/83 | HR 87 | Temp 98.4°F | Resp 18 | Wt 163.0 lb

## 2016-11-21 DIAGNOSIS — C773 Secondary and unspecified malignant neoplasm of axilla and upper limb lymph nodes: Secondary | ICD-10-CM

## 2016-11-21 DIAGNOSIS — Z510 Encounter for antineoplastic radiation therapy: Secondary | ICD-10-CM | POA: Diagnosis not present

## 2016-11-21 DIAGNOSIS — C50911 Malignant neoplasm of unspecified site of right female breast: Secondary | ICD-10-CM

## 2016-11-21 DIAGNOSIS — C50211 Malignant neoplasm of upper-inner quadrant of right female breast: Secondary | ICD-10-CM

## 2016-11-21 NOTE — Progress Notes (Signed)
START ON PATHWAY REGIMEN - Breast     A cycle is every 21 days:     Trastuzumab      Pertuzumab   **Always confirm dose/schedule in your pharmacy ordering system**    Patient Characteristics: Post-Neoadjuvant Therapy and Resection, HER2 Positive, Adjuvant Targeted Therapy After Neoadjuvant Chemo/Targeted Therapy, HER2 Positive Therapeutic Status: Post-Neoadjuvant Therapy and Resection ER Status: Positive (+) HER2 Status: Positive (+) PR Status: Positive (+) Intent of Therapy: Curative Intent, Not Discussed with Patient

## 2016-11-21 NOTE — Progress Notes (Signed)
Hematology and Oncology Follow Up Visit  Christina Joseph [id number] Jul 29, 1965 51 y.o. 11/21/2016   Principle Diagnosis:   Stage IIIA (T2N2N0) - ER+/PR+/HER2+ infiltrating ductal carcinoma of the right breast  Current Therapy:    Status post lumpectomy of the right breast with 3/10 residual lymph nodes  Patient to start radiotherapy to the right breast  Adjuvant Herceptin/Perjeta-to start on 11/28/2016     Interim History:  Christina Joseph is back for follow-up. I had her come in today because on the final path report for her breast cancer ([id number]) she was POSITIVE for HER-2/neu. Her score was 2.32.   Given that she has 3 positive lymph nodes, I think that she is at higher risk for recurrence. As such, I think that we really need to consider starting her on adjuvant anti-HER-2 therapy.  She is going to start radiation next week. I told her that we could easily do both radiation and Herceptin/Perjeta without problems.  I told her that the rationale for dual HER-2 blockade was the improved outcome in women who have HER-2 positive breast cancer.   I would offer her 12 months for right now. I noticed some recent clinical trials showed that there might be a benefit to less anti-HER-2 therapy. However, I just feel that with 3 positive lymph nodes, that she would benefit from one year of therapy followed by 1 year of oral therapy with Nerlynx.   I gave her some information sheets about Herceptin and Perjeta.   She is orally had an echocardiogram. This was done at the end of August. Her ejection fraction was 60-65%.  She is feeling well. She is working. She is not having any issues. She's having no problems with fatigue or weakness.  All the drainage tubes from her surgery have been removed.  Overall, her performance status is ECOG 0.  Medications:  Current Outpatient Prescriptions:  .  HYDROcodone-acetaminophen (NORCO/VICODIN) 5-325 MG tablet, Take 1-2 tablets by mouth every 4  (four) hours as needed for moderate pain., Disp: 30 tablet, Rfl: 0 .  valACYclovir (VALTREX) 1000 MG tablet, Take 1,000 mg by mouth daily as needed. , Disp: , Rfl:   Allergies: No Known Allergies  Past Medical History, Surgical history, Social history, and Family History were reviewed and updated.  Review of Systems: Review of Systems - Oncology  Physical Exam:  weight is 163 lb (73.9 kg). Her oral temperature is 98.4 F (36.9 C). Her blood pressure is 149/83 (abnormal) and her pulse is 87. Her respiration is 18 and oxygen saturation is 100%.   Wt Readings from Last 3 Encounters:  11/21/16 163 lb (73.9 kg)  11/14/16 162 lb 6.4 oz (73.7 kg)  11/01/16 161 lb 12.8 oz (73.4 kg)    Physical Exam   Well-developed and well-nourished white female in no obvious distress. Vital signs show a temperature of 98.7. Pulse 100. Blood pressure 130/82. Weight is 161 pounds. Head and neck exam shows no ocular or oral lesions. There are no palpable cervical or supraclavicular lymph nodes. Lungs are clear bilaterally. Cardiac exam regular rate and rhythm with no murmurs, rubs or bruits. Breast exam shows left breast no masses, edema or erythema. There is no left axillary adenopathy. Right breast is somewhat contracted from  surgery. She has the healing lumpectomy scar at a 3:00 position. This is healing. There is some slight erythema at the lumpectomy site. No distinct masses noted in the right breast. The lymphadenectomy scar is healing in the right axilla. Abdomen is  soft. She has good bowel sounds. There is no fluid wave. There is no palpable liver or spleen tip. Back exam shows no tenderness over the spine, ribs or hips. Extremities shows no clubbing, cyanosis or edema. There is no lymphedema of the right arm. Neurological exam shows no focal neurological deficits.  Lab Results  Component Value Date   WBC 4.7 11/01/2016   HGB 10.9 (L) 11/01/2016   HCT 33.9 (L) 11/01/2016   MCV 87 11/01/2016   PLT 249  11/01/2016     Chemistry      Component Value Date/Time   NA 147 (H) 11/01/2016 1051   K 3.5 11/01/2016 1051   CL 108 11/01/2016 1051   CO2 29 11/01/2016 1051   BUN 5 (L) 11/01/2016 1051   CREATININE 0.7 11/01/2016 1051      Component Value Date/Time   CALCIUM 9.6 11/01/2016 1051   ALKPHOS 51 11/01/2016 1051   AST 25 11/01/2016 1051   ALT 24 11/01/2016 1051   BILITOT 0.60 11/01/2016 1051         Impression and Plan: Christina Joseph is a 51 year old postmenopausal white female. She has a stage IIIA invasive ductal carcinoma of the right breast. This tumor is ER positive and HER-2 negative.  She clearly is at significant risk for recurrence given that she had 3 positive lymph nodes post neoadjuvant chemotherapy.  She definitely will benefit from adjuvant radiation therapy.  We will go ahead and get the Herceptin/Perjeta started next week. We will start her on aromatase inhibitor therapy after radiation is completed.   I spent about 35 minutes with her. I answered all of her questions. She has a very good understanding of why we need to use Herceptin/Perjeta combination.   I think the overriding reason is her excellent performance status. She has children now want to make sure that she is able to see them grow up and be a grandmother.    [name], MD 10/31/20185:02 PM

## 2016-11-26 ENCOUNTER — Ambulatory Visit
Admission: RE | Admit: 2016-11-26 | Discharge: 2016-11-26 | Disposition: A | Discharge status: Home / Self Care | Payer: BC Managed Care – PPO | Source: Ambulatory Visit | Attending: Radiation Oncology | Admitting: Radiation Oncology

## 2016-11-26 DIAGNOSIS — Z510 Encounter for antineoplastic radiation therapy: Secondary | ICD-10-CM | POA: Diagnosis not present

## 2016-11-26 DIAGNOSIS — Z170 Estrogen receptor positive status [ER+]: Principal | ICD-10-CM

## 2016-11-26 DIAGNOSIS — C50211 Malignant neoplasm of upper-inner quadrant of right female breast: Secondary | ICD-10-CM

## 2016-11-27 ENCOUNTER — Ambulatory Visit
Admission: RE | Admit: 2016-11-27 | Discharge: 2016-11-27 | Disposition: A | Discharge status: Home / Self Care | Payer: BC Managed Care – PPO | Source: Ambulatory Visit | Attending: Radiation Oncology | Admitting: Radiation Oncology

## 2016-11-27 ENCOUNTER — Other Ambulatory Visit: Payer: Self-pay | Admitting: *Deleted

## 2016-11-27 DIAGNOSIS — C50211 Malignant neoplasm of upper-inner quadrant of right female breast: Secondary | ICD-10-CM

## 2016-11-27 DIAGNOSIS — Z170 Estrogen receptor positive status [ER+]: Principal | ICD-10-CM

## 2016-11-27 DIAGNOSIS — C50911 Malignant neoplasm of unspecified site of right female breast: Secondary | ICD-10-CM

## 2016-11-27 DIAGNOSIS — Z510 Encounter for antineoplastic radiation therapy: Secondary | ICD-10-CM | POA: Diagnosis not present

## 2016-11-27 MED ORDER — ALRA NON-METALLIC DEODORANT (RAD-ONC)
1.0000 "application " | Freq: Once | TOPICAL | Status: AC
  Administered 2016-11-27: 1 via TOPICAL

## 2016-11-27 MED ORDER — SONAFINE EX EMUL
1.0000 "application " | Freq: Two times a day (BID) | CUTANEOUS | Status: DC
  Administered 2016-11-27: 1 via TOPICAL

## 2016-11-27 NOTE — Progress Notes (Signed)
Pt here for patient teaching.  Pt given Radiation and You booklet, skin care instructions, Alra deodorant and Sonafine.  Reviewed areas of pertinence such as fatigue, skin changes, breast tenderness and breast swelling . Pt able to give teach back of to pat skin and use unscented/gentle soap,apply Sonafine bid, avoid applying anything to skin within 4 hours of treatment, avoid wearing an under wire bra and to use an electric razor if they must shave. Pt verbalizes understanding of information given and will contact nursing with any questions or concerns.     Http://rtanswers.org/treatmentinformation/whattoexpect/index   Patient was given sonafine because radioplex was unavaible.

## 2016-11-27 NOTE — Progress Notes (Signed)
  Radiation Oncology         [phone] ________________________________  Name: Christina Joseph MRN: [id number]  Date: 11/26/2016  DOB: [birth year]  Simulation Verification Note    ICD-10-CM   1. Malignant neoplasm of upper-inner quadrant of right breast in female, estrogen receptor positive ([place]) C50.211    Z17.0     Status: outpatient  NARRATIVE: The patient was brought to the treatment unit and placed in the planned treatment position. The clinical setup was verified. Then port films were obtained and uploaded to the radiation oncology medical record software.  The treatment beams were carefully compared against the planned radiation fields. The position location and shape of the radiation fields was reviewed. They targeted volume of tissue appears to be appropriately covered by the radiation beams. Organs at risk appear to be excluded as planned.  Based on my personal review, I approved the simulation verification. The patient's treatment will proceed as planned.  -----------------------------------  Blair Promise, PhD, MD

## 2016-11-28 ENCOUNTER — Other Ambulatory Visit: Payer: BC Managed Care – PPO

## 2016-11-28 ENCOUNTER — Other Ambulatory Visit (HOSPITAL_BASED_OUTPATIENT_CLINIC_OR_DEPARTMENT_OTHER): Payer: BC Managed Care – PPO

## 2016-11-28 ENCOUNTER — Ambulatory Visit
Admission: RE | Admit: 2016-11-28 | Discharge: 2016-11-28 | Disposition: A | Discharge status: Home / Self Care | Payer: BC Managed Care – PPO | Source: Ambulatory Visit | Attending: Radiation Oncology | Admitting: Radiation Oncology

## 2016-11-28 ENCOUNTER — Ambulatory Visit: Payer: BC Managed Care – PPO

## 2016-11-28 ENCOUNTER — Ambulatory Visit (HOSPITAL_BASED_OUTPATIENT_CLINIC_OR_DEPARTMENT_OTHER): Payer: BC Managed Care – PPO

## 2016-11-28 VITALS — BP 138/88 | HR 88 | Temp 98.2°F | Resp 18

## 2016-11-28 DIAGNOSIS — C50911 Malignant neoplasm of unspecified site of right female breast: Secondary | ICD-10-CM

## 2016-11-28 DIAGNOSIS — Z5112 Encounter for antineoplastic immunotherapy: Secondary | ICD-10-CM | POA: Diagnosis not present

## 2016-11-28 DIAGNOSIS — C773 Secondary and unspecified malignant neoplasm of axilla and upper limb lymph nodes: Secondary | ICD-10-CM

## 2016-11-28 DIAGNOSIS — Z170 Estrogen receptor positive status [ER+]: Principal | ICD-10-CM

## 2016-11-28 DIAGNOSIS — Z510 Encounter for antineoplastic radiation therapy: Secondary | ICD-10-CM | POA: Diagnosis not present

## 2016-11-28 LAB — CBC WITH DIFFERENTIAL (CANCER CENTER ONLY)
BASO#: 0 10*3/uL (ref 0.0–0.2)
BASO%: 0.5 % (ref 0.0–2.0)
EOS%: 1.6 % (ref 0.0–7.0)
Eosinophils Absolute: 0.1 10*3/uL (ref 0.0–0.5)
HEMATOCRIT: 32.3 % — AB (ref 34.8–46.6)
HGB: 10.4 g/dL — ABNORMAL LOW (ref 11.6–15.9)
LYMPH#: 1.1 10*3/uL (ref 0.9–3.3)
LYMPH%: 26 % (ref 14.0–48.0)
MCH: 28 pg (ref 26.0–34.0)
MCHC: 32.2 g/dL (ref 32.0–36.0)
MCV: 87 fL (ref 81–101)
MONO#: 0.3 10*3/uL (ref 0.1–0.9)
MONO%: 7.4 % (ref 0.0–13.0)
NEUT#: 2.8 10*3/uL (ref 1.5–6.5)
NEUT%: 64.5 % (ref 39.6–80.0)
PLATELETS: 210 10*3/uL (ref 145–400)
RBC: 3.72 10*6/uL (ref 3.70–5.32)
RDW: 14.4 % (ref 11.1–15.7)
WBC: 4.3 10*3/uL (ref 3.9–10.0)

## 2016-11-28 LAB — CMP (CANCER CENTER ONLY)
ALK PHOS: 55 U/L (ref 26–84)
ALT: 22 U/L (ref 10–47)
AST: 26 U/L (ref 11–38)
Albumin: 3.4 g/dL (ref 3.3–5.5)
BILIRUBIN TOTAL: 0.5 mg/dL (ref 0.20–1.60)
BUN, Bld: 9 mg/dL (ref 7–22)
CALCIUM: 9.3 mg/dL (ref 8.0–10.3)
CO2: 29 mEq/L (ref 18–33)
Chloride: 107 mEq/L (ref 98–108)
Creat: 0.8 mg/dl (ref 0.6–1.2)
GLUCOSE: 106 mg/dL (ref 73–118)
Potassium: 3.4 mEq/L (ref 3.3–4.7)
Sodium: 145 mEq/L (ref 128–145)
TOTAL PROTEIN: 6.7 g/dL (ref 6.4–8.1)

## 2016-11-28 MED ORDER — SODIUM CHLORIDE 0.9% FLUSH
10.0000 mL | INTRAVENOUS | Status: DC | PRN
  Administered 2016-11-28: 10 mL
  Filled 2016-11-28: qty 10

## 2016-11-28 MED ORDER — ACETAMINOPHEN 325 MG PO TABS
650.0000 mg | ORAL_TABLET | Freq: Once | ORAL | Status: AC
  Administered 2016-11-28: 650 mg via ORAL

## 2016-11-28 MED ORDER — SODIUM CHLORIDE 0.9 % IV SOLN
600.0000 mg | Freq: Once | INTRAVENOUS | Status: AC
  Administered 2016-11-28: 600 mg via INTRAVENOUS
  Filled 2016-11-28: qty 28.57

## 2016-11-28 MED ORDER — DIPHENHYDRAMINE HCL 25 MG PO CAPS
ORAL_CAPSULE | ORAL | Status: AC
  Filled 2016-11-28: qty 2

## 2016-11-28 MED ORDER — SODIUM CHLORIDE 0.9 % IV SOLN
840.0000 mg | Freq: Once | INTRAVENOUS | Status: AC
  Administered 2016-11-28: 840 mg via INTRAVENOUS
  Filled 2016-11-28: qty 28

## 2016-11-28 MED ORDER — ACETAMINOPHEN 325 MG PO TABS
ORAL_TABLET | ORAL | Status: AC
  Filled 2016-11-28: qty 2

## 2016-11-28 MED ORDER — SODIUM CHLORIDE 0.9 % IV SOLN
Freq: Once | INTRAVENOUS | Status: AC
  Administered 2016-11-28: 12:00:00 via INTRAVENOUS

## 2016-11-28 MED ORDER — HEPARIN SOD (PORK) LOCK FLUSH 100 UNIT/ML IV SOLN
500.0000 [IU] | Freq: Once | INTRAVENOUS | Status: AC | PRN
  Administered 2016-11-28: 500 [IU]
  Filled 2016-11-28: qty 5

## 2016-11-28 MED ORDER — DIPHENHYDRAMINE HCL 25 MG PO CAPS
50.0000 mg | ORAL_CAPSULE | Freq: Once | ORAL | Status: AC
  Administered 2016-11-28: 50 mg via ORAL

## 2016-11-28 NOTE — Progress Notes (Signed)
Discharged at 11:40 to infusion.

## 2016-11-28 NOTE — Patient Instructions (Signed)
Trastuzumab injection for infusion What is this medicine? TRASTUZUMAB (tras TOO zoo mab) is a monoclonal antibody. It is used to treat breast cancer and stomach cancer. This medicine may be used for other purposes; ask your health care provider or pharmacist if you have questions. COMMON BRAND NAME(S): Herceptin What should I tell my health care provider before I take this medicine? They need to know if you have any of these conditions: -heart disease -heart failure -lung or breathing disease, like asthma -an unusual or allergic reaction to trastuzumab, benzyl alcohol, or other medications, foods, dyes, or preservatives -pregnant or trying to get pregnant -breast-feeding How should I use this medicine? This drug is given as an infusion into a vein. It is administered in a hospital or clinic by a specially trained health care professional. Talk to your pediatrician regarding the use of this medicine in children. This medicine is not approved for use in children. Overdosage: If you think you have taken too much of this medicine contact a poison control center or emergency room at once. NOTE: This medicine is only for you. Do not share this medicine with others. What if I miss a dose? It is important not to miss a dose. Call your doctor or health care professional if you are unable to keep an appointment. What may interact with this medicine? This medicine may interact with the following medications: -certain types of chemotherapy, such as daunorubicin, doxorubicin, epirubicin, and idarubicin This list may not describe all possible interactions. Give your health care provider a list of all the medicines, herbs, non-prescription drugs, or dietary supplements you use. Also tell them if you smoke, drink alcohol, or use illegal drugs. Some items may interact with your medicine. What should I watch for while using this medicine? Visit your doctor for checks on your progress. Report any side effects.  Continue your course of treatment even though you feel ill unless your doctor tells you to stop. Call your doctor or health care professional for advice if you get a fever, chills or sore throat, or other symptoms of a cold or flu. Do not treat yourself. Try to avoid being around people who are sick. You may experience fever, chills and shaking during your first infusion. These effects are usually mild and can be treated with other medicines. Report any side effects during the infusion to your health care professional. Fever and chills usually do not happen with later infusions. Do not become pregnant while taking this medicine or for 7 months after stopping it. Women should inform their doctor if they wish to become pregnant or think they might be pregnant. Women of child-bearing potential will need to have a negative pregnancy test before starting this medicine. There is a potential for serious side effects to an unborn child. Talk to your health care professional or pharmacist for more information. Do not breast-feed an infant while taking this medicine or for 7 months after stopping it. Women must use effective birth control with this medicine. What side effects may I notice from receiving this medicine? Side effects that you should report to your doctor or health care professional as soon as possible: -allergic reactions like skin rash, itching or hives, swelling of the face, lips, or tongue -chest pain or palpitations -cough -dizziness -feeling faint or lightheaded, falls -fever -general ill feeling or flu-like symptoms -signs of worsening heart failure like breathing problems; swelling in your legs and feet -unusually weak or tired Side effects that usually do not require medical   or lightheaded, falls  -fever  -general ill feeling or flu-like symptoms  -signs of worsening heart failure like breathing problems; swelling in your legs and feet  -unusually weak or tired  Side effects that usually do not require medical attention (report to your doctor or health care professional if they continue or are bothersome):  -bone pain  -changes in taste  -diarrhea  -joint pain  -nausea/vomiting  -weight loss  This list may not describe all  possible side effects. Call your doctor for medical advice about side effects. You may report side effects to FDA at 1-800-FDA-1088.  Where should I keep my medicine?  This drug is given in a hospital or clinic and will not be stored at home.  NOTE: This sheet is a summary. It may not cover all possible information. If you have questions about this medicine, talk to your doctor, pharmacist, or health care provider.   2018 Elsevier/Gold Standard (2016-01-03 14:37:52)  Pertuzumab injection  What is this medicine?  PERTUZUMAB (per TOOZ ue mab) is a monoclonal antibody. It is used to treat breast cancer.  This medicine may be used for other purposes; ask your health care provider or pharmacist if you have questions.  COMMON BRAND NAME(S): PERJETA  What should I tell my health care provider before I take this medicine?  They need to know if you have any of these conditions:  -heart disease  -heart failure  -high blood pressure  -history of irregular heart beat  -recent or ongoing radiation therapy  -an unusual or allergic reaction to pertuzumab, other medicines, foods, dyes, or preservatives  -pregnant or trying to get pregnant  -breast-feeding  How should I use this medicine?  This medicine is for infusion into a vein. It is given by a health care professional in a hospital or clinic setting.  Talk to your pediatrician regarding the use of this medicine in children. Special care may be needed.  Overdosage: If you think you have taken too much of this medicine contact a poison control center or emergency room at once.  NOTE: This medicine is only for you. Do not share this medicine with others.  What if I miss a dose?  It is important not to miss your dose. Call your doctor or health care professional if you are unable to keep an appointment.  What may interact with this medicine?  Interactions are not expected.  Give your health care provider a list of all the medicines, herbs, non-prescription drugs, or dietary  supplements you use. Also tell them if you smoke, drink alcohol, or use illegal drugs. Some items may interact with your medicine.  This list may not describe all possible interactions. Give your health care provider a list of all the medicines, herbs, non-prescription drugs, or dietary supplements you use. Also tell them if you smoke, drink alcohol, or use illegal drugs. Some items may interact with your medicine.  What should I watch for while using this medicine?  Your condition will be monitored carefully while you are receiving this medicine. Report any side effects. Continue your course of treatment even though you feel ill unless your doctor tells you to stop.  Do not become pregnant while taking this medicine or for 7 months after stopping it. Women should inform their doctor if they wish to become pregnant or think they might be pregnant. Women of child-bearing potential will need to have a negative pregnancy test before starting this medicine. There is a potential for serious side   effects to an unborn child. Talk to your health care professional or pharmacist for more information. Do not breast-feed an infant while taking this medicine or for 7 months after stopping it.  Women must use effective birth control with this medicine.  Call your doctor or health care professional for advice if you get a fever, chills or sore throat, or other symptoms of a cold or flu. Do not treat yourself. Try to avoid being around people who are sick.  You may experience fever, chills, and headache during the infusion. Report any side effects during the infusion to your health care professional.  What side effects may I notice from receiving this medicine?  Side effects that you should report to your doctor or health care professional as soon as possible:  -breathing problems  -chest pain or palpitations  -dizziness  -feeling faint or lightheaded  -fever or chills  -skin rash, itching or hives  -sore throat  -swelling of the  face, lips, or tongue  -swelling of the legs or ankles  -unusually weak or tired  Side effects that usually do not require medical attention (report to your doctor or health care professional if they continue or are bothersome):  -diarrhea  -hair loss  -nausea, vomiting  -tiredness  This list may not describe all possible side effects. Call your doctor for medical advice about side effects. You may report side effects to FDA at 1-800-FDA-1088.  Where should I keep my medicine?  This drug is given in a hospital or clinic and will not be stored at home.  NOTE: This sheet is a summary. It may not cover all possible information. If you have questions about this medicine, talk to your doctor, pharmacist, or health care provider.   2018 Elsevier/Gold Standard (2015-02-10 12:08:50)

## 2016-11-29 ENCOUNTER — Ambulatory Visit
Admission: RE | Admit: 2016-11-29 | Discharge: 2016-11-29 | Disposition: A | Discharge status: Home / Self Care | Payer: BC Managed Care – PPO | Source: Ambulatory Visit | Attending: Radiation Oncology | Admitting: Radiation Oncology

## 2016-11-29 ENCOUNTER — Telehealth: Payer: Self-pay

## 2016-11-29 DIAGNOSIS — Z510 Encounter for antineoplastic radiation therapy: Secondary | ICD-10-CM | POA: Diagnosis not present

## 2016-11-30 ENCOUNTER — Ambulatory Visit
Admission: RE | Admit: 2016-11-30 | Discharge: 2016-11-30 | Disposition: A | Discharge status: Home / Self Care | Payer: BC Managed Care – PPO | Source: Ambulatory Visit | Attending: Radiation Oncology | Admitting: Radiation Oncology

## 2016-11-30 DIAGNOSIS — Z510 Encounter for antineoplastic radiation therapy: Secondary | ICD-10-CM | POA: Diagnosis not present

## 2016-11-30 NOTE — Telephone Encounter (Signed)
Chemo f/u call: Left message for patient on 11/29/16. Patient returned call on 11/30/16 - stated that she "feels great".

## 2016-12-03 ENCOUNTER — Ambulatory Visit
Admission: RE | Admit: 2016-12-03 | Discharge: 2016-12-03 | Disposition: A | Discharge status: Home / Self Care | Payer: BC Managed Care – PPO | Source: Ambulatory Visit | Attending: Radiation Oncology | Admitting: Radiation Oncology

## 2016-12-03 DIAGNOSIS — Z510 Encounter for antineoplastic radiation therapy: Secondary | ICD-10-CM | POA: Diagnosis not present

## 2016-12-04 ENCOUNTER — Ambulatory Visit
Admission: RE | Admit: 2016-12-04 | Discharge: 2016-12-04 | Disposition: A | Discharge status: Home / Self Care | Payer: BC Managed Care – PPO | Source: Ambulatory Visit | Attending: Radiation Oncology | Admitting: Radiation Oncology

## 2016-12-04 DIAGNOSIS — Z510 Encounter for antineoplastic radiation therapy: Secondary | ICD-10-CM | POA: Diagnosis not present

## 2016-12-04 DIAGNOSIS — Z170 Estrogen receptor positive status [ER+]: Principal | ICD-10-CM

## 2016-12-04 DIAGNOSIS — C50211 Malignant neoplasm of upper-inner quadrant of right female breast: Secondary | ICD-10-CM

## 2016-12-04 MED ORDER — RADIAPLEXRX EX GEL
Freq: Two times a day (BID) | CUTANEOUS | Status: DC
  Administered 2016-12-04: 11:00:00 via TOPICAL

## 2016-12-05 ENCOUNTER — Ambulatory Visit
Admission: RE | Admit: 2016-12-05 | Discharge: 2016-12-05 | Disposition: A | Discharge status: Home / Self Care | Payer: BC Managed Care – PPO | Source: Ambulatory Visit | Attending: Radiation Oncology | Admitting: Radiation Oncology

## 2016-12-05 DIAGNOSIS — Z510 Encounter for antineoplastic radiation therapy: Secondary | ICD-10-CM | POA: Diagnosis not present

## 2016-12-06 ENCOUNTER — Ambulatory Visit
Admission: RE | Admit: 2016-12-06 | Discharge: 2016-12-06 | Disposition: A | Discharge status: Home / Self Care | Payer: BC Managed Care – PPO | Source: Ambulatory Visit | Attending: Radiation Oncology | Admitting: Radiation Oncology

## 2016-12-06 DIAGNOSIS — Z510 Encounter for antineoplastic radiation therapy: Secondary | ICD-10-CM | POA: Diagnosis not present

## 2016-12-07 ENCOUNTER — Ambulatory Visit
Admission: RE | Admit: 2016-12-07 | Discharge: 2016-12-07 | Disposition: A | Discharge status: Home / Self Care | Payer: BC Managed Care – PPO | Source: Ambulatory Visit | Attending: Radiation Oncology | Admitting: Radiation Oncology

## 2016-12-07 DIAGNOSIS — Z510 Encounter for antineoplastic radiation therapy: Secondary | ICD-10-CM | POA: Diagnosis not present

## 2016-12-09 ENCOUNTER — Ambulatory Visit
Admission: RE | Admit: 2016-12-09 | Discharge: 2016-12-09 | Disposition: A | Discharge status: Home / Self Care | Payer: BC Managed Care – PPO | Source: Ambulatory Visit | Attending: Radiation Oncology | Admitting: Radiation Oncology

## 2016-12-09 DIAGNOSIS — Z510 Encounter for antineoplastic radiation therapy: Secondary | ICD-10-CM | POA: Diagnosis not present

## 2016-12-10 ENCOUNTER — Ambulatory Visit
Admission: RE | Admit: 2016-12-10 | Discharge: 2016-12-10 | Disposition: A | Discharge status: Home / Self Care | Payer: BC Managed Care – PPO | Source: Ambulatory Visit | Attending: Radiation Oncology | Admitting: Radiation Oncology

## 2016-12-10 DIAGNOSIS — Z510 Encounter for antineoplastic radiation therapy: Secondary | ICD-10-CM | POA: Diagnosis not present

## 2016-12-11 ENCOUNTER — Ambulatory Visit
Admission: RE | Admit: 2016-12-11 | Discharge: 2016-12-11 | Disposition: A | Discharge status: Home / Self Care | Payer: BC Managed Care – PPO | Source: Ambulatory Visit | Attending: Radiation Oncology | Admitting: Radiation Oncology

## 2016-12-11 DIAGNOSIS — Z510 Encounter for antineoplastic radiation therapy: Secondary | ICD-10-CM | POA: Diagnosis not present

## 2016-12-12 ENCOUNTER — Ambulatory Visit
Admission: RE | Admit: 2016-12-12 | Discharge: 2016-12-12 | Disposition: A | Discharge status: Home / Self Care | Payer: BC Managed Care – PPO | Source: Ambulatory Visit | Attending: Radiation Oncology | Admitting: Radiation Oncology

## 2016-12-12 DIAGNOSIS — Z510 Encounter for antineoplastic radiation therapy: Secondary | ICD-10-CM | POA: Diagnosis not present

## 2016-12-17 ENCOUNTER — Ambulatory Visit
Admission: RE | Admit: 2016-12-17 | Discharge: 2016-12-17 | Disposition: A | Discharge status: Home / Self Care | Payer: BC Managed Care – PPO | Source: Ambulatory Visit | Attending: Radiation Oncology | Admitting: Radiation Oncology

## 2016-12-17 DIAGNOSIS — Z510 Encounter for antineoplastic radiation therapy: Secondary | ICD-10-CM | POA: Diagnosis not present

## 2016-12-18 ENCOUNTER — Ambulatory Visit
Admission: RE | Admit: 2016-12-18 | Discharge: 2016-12-18 | Disposition: A | Discharge status: Home / Self Care | Payer: BC Managed Care – PPO | Source: Ambulatory Visit | Attending: Radiation Oncology | Admitting: Radiation Oncology

## 2016-12-18 DIAGNOSIS — Z510 Encounter for antineoplastic radiation therapy: Secondary | ICD-10-CM | POA: Diagnosis not present

## 2016-12-19 ENCOUNTER — Ambulatory Visit (HOSPITAL_BASED_OUTPATIENT_CLINIC_OR_DEPARTMENT_OTHER): Payer: BC Managed Care – PPO | Admitting: Hematology & Oncology

## 2016-12-19 ENCOUNTER — Ambulatory Visit (HOSPITAL_BASED_OUTPATIENT_CLINIC_OR_DEPARTMENT_OTHER): Payer: BC Managed Care – PPO

## 2016-12-19 ENCOUNTER — Other Ambulatory Visit (HOSPITAL_BASED_OUTPATIENT_CLINIC_OR_DEPARTMENT_OTHER): Payer: BC Managed Care – PPO

## 2016-12-19 ENCOUNTER — Other Ambulatory Visit: Payer: Self-pay

## 2016-12-19 ENCOUNTER — Encounter: Payer: Self-pay | Admitting: Hematology & Oncology

## 2016-12-19 ENCOUNTER — Ambulatory Visit
Admission: RE | Admit: 2016-12-19 | Discharge: 2016-12-19 | Disposition: A | Discharge status: Home / Self Care | Payer: BC Managed Care – PPO | Source: Ambulatory Visit | Attending: Radiation Oncology | Admitting: Radiation Oncology

## 2016-12-19 ENCOUNTER — Ambulatory Visit: Payer: BC Managed Care – PPO

## 2016-12-19 DIAGNOSIS — C773 Secondary and unspecified malignant neoplasm of axilla and upper limb lymph nodes: Secondary | ICD-10-CM

## 2016-12-19 DIAGNOSIS — Z5112 Encounter for antineoplastic immunotherapy: Secondary | ICD-10-CM

## 2016-12-19 DIAGNOSIS — C50911 Malignant neoplasm of unspecified site of right female breast: Secondary | ICD-10-CM | POA: Diagnosis not present

## 2016-12-19 DIAGNOSIS — T386X5A Adverse effect of antigonadotrophins, antiestrogens, antiandrogens, not elsewhere classified, initial encounter: Secondary | ICD-10-CM

## 2016-12-19 DIAGNOSIS — Z170 Estrogen receptor positive status [ER+]: Principal | ICD-10-CM

## 2016-12-19 DIAGNOSIS — C50211 Malignant neoplasm of upper-inner quadrant of right female breast: Secondary | ICD-10-CM

## 2016-12-19 DIAGNOSIS — M818 Other osteoporosis without current pathological fracture: Secondary | ICD-10-CM

## 2016-12-19 DIAGNOSIS — Z510 Encounter for antineoplastic radiation therapy: Secondary | ICD-10-CM | POA: Diagnosis not present

## 2016-12-19 LAB — CMP (CANCER CENTER ONLY)
ALBUMIN: 3.8 g/dL (ref 3.3–5.5)
ALT: 22 U/L (ref 10–47)
AST: 24 U/L (ref 11–38)
Alkaline Phosphatase: 67 U/L (ref 26–84)
BILIRUBIN TOTAL: 0.6 mg/dL (ref 0.20–1.60)
BUN: 8 mg/dL (ref 7–22)
CHLORIDE: 108 meq/L (ref 98–108)
CO2: 27 mEq/L (ref 18–33)
CREATININE: 0.5 mg/dL — AB (ref 0.6–1.2)
Calcium: 9.2 mg/dL (ref 8.0–10.3)
Glucose, Bld: 96 mg/dL (ref 73–118)
Potassium: 3.3 mEq/L (ref 3.3–4.7)
Sodium: 147 mEq/L — ABNORMAL HIGH (ref 128–145)
TOTAL PROTEIN: 6.7 g/dL (ref 6.4–8.1)

## 2016-12-19 LAB — CBC WITH DIFFERENTIAL (CANCER CENTER ONLY)
BASO#: 0 10*3/uL (ref 0.0–0.2)
BASO%: 0.5 % (ref 0.0–2.0)
EOS ABS: 0.1 10*3/uL (ref 0.0–0.5)
EOS%: 1.3 % (ref 0.0–7.0)
HEMATOCRIT: 31.7 % — AB (ref 34.8–46.6)
HEMOGLOBIN: 10.4 g/dL — AB (ref 11.6–15.9)
LYMPH#: 0.8 10*3/uL — AB (ref 0.9–3.3)
LYMPH%: 20.6 % (ref 14.0–48.0)
MCH: 27.9 pg (ref 26.0–34.0)
MCHC: 32.8 g/dL (ref 32.0–36.0)
MCV: 85 fL (ref 81–101)
MONO#: 0.3 10*3/uL (ref 0.1–0.9)
MONO%: 7.5 % (ref 0.0–13.0)
NEUT%: 70.1 % (ref 39.6–80.0)
NEUTROS ABS: 2.6 10*3/uL (ref 1.5–6.5)
Platelets: 208 10*3/uL (ref 145–400)
RBC: 3.73 10*6/uL (ref 3.70–5.32)
RDW: 14.4 % (ref 11.1–15.7)
WBC: 3.7 10*3/uL — ABNORMAL LOW (ref 3.9–10.0)

## 2016-12-19 LAB — LACTATE DEHYDROGENASE: LDH: 179 U/L (ref 125–245)

## 2016-12-19 MED ORDER — SODIUM CHLORIDE 0.9% FLUSH
10.0000 mL | INTRAVENOUS | Status: DC | PRN
  Administered 2016-12-19: 10 mL
  Filled 2016-12-19: qty 10

## 2016-12-19 MED ORDER — HEPARIN SOD (PORK) LOCK FLUSH 100 UNIT/ML IV SOLN
500.0000 [IU] | Freq: Once | INTRAVENOUS | Status: AC | PRN
  Administered 2016-12-19: 500 [IU]
  Filled 2016-12-19: qty 5

## 2016-12-19 MED ORDER — ACETAMINOPHEN 325 MG PO TABS
ORAL_TABLET | ORAL | Status: AC
  Filled 2016-12-19: qty 2

## 2016-12-19 MED ORDER — SODIUM CHLORIDE 0.9 % IV SOLN
420.0000 mg | Freq: Once | INTRAVENOUS | Status: AC
  Administered 2016-12-19: 420 mg via INTRAVENOUS
  Filled 2016-12-19: qty 14

## 2016-12-19 MED ORDER — SODIUM CHLORIDE 0.9 % IV SOLN
Freq: Once | INTRAVENOUS | Status: AC
  Administered 2016-12-19: 11:00:00 via INTRAVENOUS

## 2016-12-19 MED ORDER — ACETAMINOPHEN 325 MG PO TABS
650.0000 mg | ORAL_TABLET | Freq: Once | ORAL | Status: AC
  Administered 2016-12-19: 650 mg via ORAL

## 2016-12-19 MED ORDER — DIPHENHYDRAMINE HCL 25 MG PO CAPS
50.0000 mg | ORAL_CAPSULE | Freq: Once | ORAL | Status: AC
  Administered 2016-12-19: 50 mg via ORAL

## 2016-12-19 MED ORDER — DIPHENHYDRAMINE HCL 25 MG PO CAPS
ORAL_CAPSULE | ORAL | Status: AC
  Filled 2016-12-19: qty 2

## 2016-12-19 MED ORDER — SODIUM CHLORIDE 0.9 % IV SOLN
450.0000 mg | Freq: Once | INTRAVENOUS | Status: AC
  Administered 2016-12-19: 450 mg via INTRAVENOUS
  Filled 2016-12-19: qty 21.43

## 2016-12-19 NOTE — Patient Instructions (Signed)
Pertuzumab injection What is this medicine? PERTUZUMAB (per TOOZ ue mab) is a monoclonal antibody. It is used to treat breast cancer. This medicine may be used for other purposes; ask your health care provider or pharmacist if you have questions. COMMON BRAND NAME(S): PERJETA What should I tell my health care provider before I take this medicine? They need to know if you have any of these conditions: -heart disease -heart failure -high blood pressure -history of irregular heart beat -recent or ongoing radiation therapy -an unusual or allergic reaction to pertuzumab, other medicines, foods, dyes, or preservatives -pregnant or trying to get pregnant -breast-feeding How should I use this medicine? This medicine is for infusion into a vein. It is given by a health care professional in a hospital or clinic setting. Talk to your pediatrician regarding the use of this medicine in children. Special care may be needed. Overdosage: If you think you have taken too much of this medicine contact a poison control center or emergency room at once. NOTE: This medicine is only for you. Do not share this medicine with others. What if I miss a dose? It is important not to miss your dose. Call your doctor or health care professional if you are unable to keep an appointment. What may interact with this medicine? Interactions are not expected. Give your health care provider a list of all the medicines, herbs, non-prescription drugs, or dietary supplements you use. Also tell them if you smoke, drink alcohol, or use illegal drugs. Some items may interact with your medicine. This list may not describe all possible interactions. Give your health care provider a list of all the medicines, herbs, non-prescription drugs, or dietary supplements you use. Also tell them if you smoke, drink alcohol, or use illegal drugs. Some items may interact with your medicine. What should I watch for while using this medicine? Your  condition will be monitored carefully while you are receiving this medicine. Report any side effects. Continue your course of treatment even though you feel ill unless your doctor tells you to stop. Do not become pregnant while taking this medicine or for 7 months after stopping it. Women should inform their doctor if they wish to become pregnant or think they might be pregnant. Women of child-bearing potential will need to have a negative pregnancy test before starting this medicine. There is a potential for serious side effects to an unborn child. Talk to your health care professional or pharmacist for more information. Do not breast-feed an infant while taking this medicine or for 7 months after stopping it. Women must use effective birth control with this medicine. Call your doctor or health care professional for advice if you get a fever, chills or sore throat, or other symptoms of a cold or flu. Do not treat yourself. Try to avoid being around people who are sick. You may experience fever, chills, and headache during the infusion. Report any side effects during the infusion to your health care professional. What side effects may I notice from receiving this medicine? Side effects that you should report to your doctor or health care professional as soon as possible: -breathing problems -chest pain or palpitations -dizziness -feeling faint or lightheaded -fever or chills -skin rash, itching or hives -sore throat -swelling of the face, lips, or tongue -swelling of the legs or ankles -unusually weak or tired Side effects that usually do not require medical attention (report to your doctor or health care professional if they continue or are bothersome): -diarrhea -hair   loss -nausea, vomiting -tiredness This list may not describe all possible side effects. Call your doctor for medical advice about side effects. You may report side effects to FDA at 1-800-FDA-1088. Where should I keep my  medicine? This drug is given in a hospital or clinic and will not be stored at home. NOTE: This sheet is a summary. It may not cover all possible information. If you have questions about this medicine, talk to your doctor, pharmacist, or health care provider.  2018 Elsevier/Gold Standard (2015-02-10 12:08:50) Trastuzumab injection for infusion What is this medicine? TRASTUZUMAB (tras TOO zoo mab) is a monoclonal antibody. It is used to treat breast cancer and stomach cancer. This medicine may be used for other purposes; ask your health care provider or pharmacist if you have questions. COMMON BRAND NAME(S): Herceptin What should I tell my health care provider before I take this medicine? They need to know if you have any of these conditions: -heart disease -heart failure -lung or breathing disease, like asthma -an unusual or allergic reaction to trastuzumab, benzyl alcohol, or other medications, foods, dyes, or preservatives -pregnant or trying to get pregnant -breast-feeding How should I use this medicine? This drug is given as an infusion into a vein. It is administered in a hospital or clinic by a specially trained health care professional. Talk to your pediatrician regarding the use of this medicine in children. This medicine is not approved for use in children. Overdosage: If you think you have taken too much of this medicine contact a poison control center or emergency room at once. NOTE: This medicine is only for you. Do not share this medicine with others. What if I miss a dose? It is important not to miss a dose. Call your doctor or health care professional if you are unable to keep an appointment. What may interact with this medicine? This medicine may interact with the following medications: -certain types of chemotherapy, such as daunorubicin, doxorubicin, epirubicin, and idarubicin This list may not describe all possible interactions. Give your health care provider a list of  all the medicines, herbs, non-prescription drugs, or dietary supplements you use. Also tell them if you smoke, drink alcohol, or use illegal drugs. Some items may interact with your medicine. What should I watch for while using this medicine? Visit your doctor for checks on your progress. Report any side effects. Continue your course of treatment even though you feel ill unless your doctor tells you to stop. Call your doctor or health care professional for advice if you get a fever, chills or sore throat, or other symptoms of a cold or flu. Do not treat yourself. Try to avoid being around people who are sick. You may experience fever, chills and shaking during your first infusion. These effects are usually mild and can be treated with other medicines. Report any side effects during the infusion to your health care professional. Fever and chills usually do not happen with later infusions. Do not become pregnant while taking this medicine or for 7 months after stopping it. Women should inform their doctor if they wish to become pregnant or think they might be pregnant. Women of child-bearing potential will need to have a negative pregnancy test before starting this medicine. There is a potential for serious side effects to an unborn child. Talk to your health care professional or pharmacist for more information. Do not breast-feed an infant while taking this medicine or for 7 months after stopping it. Women must use effective birth control   with this medicine. What side effects may I notice from receiving this medicine? Side effects that you should report to your doctor or health care professional as soon as possible: -allergic reactions like skin rash, itching or hives, swelling of the face, lips, or tongue -chest pain or palpitations -cough -dizziness -feeling faint or lightheaded, falls -fever -general ill feeling or flu-like symptoms -signs of worsening heart failure like breathing problems;  swelling in your legs and feet -unusually weak or tired Side effects that usually do not require medical attention (report to your doctor or health care professional if they continue or are bothersome): -bone pain -changes in taste -diarrhea -joint pain -nausea/vomiting -weight loss This list may not describe all possible side effects. Call your doctor for medical advice about side effects. You may report side effects to FDA at 1-800-FDA-1088. Where should I keep my medicine? This drug is given in a hospital or clinic and will not be stored at home. NOTE: This sheet is a summary. It may not cover all possible information. If you have questions about this medicine, talk to your doctor, pharmacist, or health care provider.  2018 Elsevier/Gold Standard (2016-01-03 14:37:52)  

## 2016-12-20 ENCOUNTER — Ambulatory Visit
Admission: RE | Admit: 2016-12-20 | Discharge: 2016-12-20 | Disposition: A | Discharge status: Home / Self Care | Payer: BC Managed Care – PPO | Source: Ambulatory Visit | Attending: Radiation Oncology | Admitting: Radiation Oncology

## 2016-12-20 DIAGNOSIS — Z510 Encounter for antineoplastic radiation therapy: Secondary | ICD-10-CM | POA: Diagnosis not present

## 2016-12-20 LAB — FOLLICLE STIMULATING HORMONE: FSH: 68.5 m[IU]/mL

## 2016-12-20 LAB — LUTEINIZING HORMONE: LH: 28.5 m[IU]/mL

## 2016-12-20 NOTE — Progress Notes (Signed)
Hematology and Oncology Follow Up Visit  Christina Joseph [id number] [birth year] 51 y.o. 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 I had her come in today because on the final path report for her breast cancer ([id number]) she was POSITIVE for HER-2/neu. Her score was 2.32.   She is doing pretty well.  She is halfway through her radiation therapy.  She is really having no problems with this so far.  She and her family had a very nice Thanksgiving.  She felt like she ate quite a bit.  She has had no problems with the Herceptin/Perjeta.  She is tolerated this pretty well.  She has had no cough or shortness of breath.  He has had no nausea or vomiting.  She has had no change in bowel or bladder habits.  She has had no leg swelling.  She has had no bleeding.  She has had no fever.    Overall, her performance status is ECOG 1.    Medications:  Current Outpatient Medications:  .  HYDROcodone-acetaminophen (NORCO/VICODIN) 5-325 MG tablet, Take 1-2 tablets by mouth every 4 (four) hours as needed for moderate pain., Disp: 30 tablet, Rfl: 0 .  valACYclovir (VALTREX) 1000 MG tablet, Take 1,000 mg by mouth daily as needed. , Disp: , Rfl:   Allergies: No Known Allergies  Past Medical History, Surgical history, Social history, and Family History were reviewed and updated.  Review of Systems: Review of Systems - Oncology  Physical Exam:  vitals were not taken for this visit.   Wt Readings from Last 3 Encounters:  12/19/16 164 lb (74.4 kg)  11/21/16 163 lb (73.9 kg)  11/14/16 162 lb 6.4 oz (73.7 kg)    Physical Exam   Well-developed and well-nourished white female in  no obvious distress. Vital signs show a temperature of 98.7. Pulse 100. Blood pressure 130/82. Weight is 161 pounds. Head and neck exam shows no ocular or oral lesions. There are no palpable cervical or supraclavicular lymph nodes. Lungs are clear bilaterally. Cardiac exam regular rate and rhythm with no murmurs, rubs or bruits. Breast exam shows left breast no masses, edema or erythema. There is no left axillary adenopathy. Right breast is somewhat contracted from  surgery. She has the healing lumpectomy scar at a 3:00 position. This is healing. There is some slight erythema at the lumpectomy site. No distinct masses noted in the right breast. The lymphadenectomy scar is healing in the right axilla. Abdomen is soft. She has good bowel sounds. There is no fluid wave. There is no palpable liver or spleen tip. Back exam shows no tenderness over the spine, ribs or hips. Extremities shows no clubbing, cyanosis or edema. There is no lymphedema of the right arm. Neurological exam shows no focal neurological deficits.  Lab Results  Component Value Date   WBC 3.7 (L) 12/19/2016   HGB 10.4 (L) 12/19/2016   HCT 31.7 (L) 12/19/2016   MCV 85 12/19/2016   PLT 208 12/19/2016     Chemistry      Component Value Date/Time   NA 147 (H) 12/19/2016 0946   K 3.3 12/19/2016 0946   CL 108 12/19/2016 0946  CO2 27 12/19/2016 0946   BUN 8 12/19/2016 0946   CREATININE 0.5 (L) 12/19/2016 0946      Component Value Date/Time   CALCIUM 9.2 12/19/2016 0946   ALKPHOS 67 12/19/2016 0946   AST 24 12/19/2016 0946   ALT 22 12/19/2016 0946   BILITOT 0.60 12/19/2016 0946         Impression and Plan: Christina Joseph is a 51 year old postmenopausal white female. She has a stage IIIA invasive ductal carcinoma of the right breast. This tumor is ER positive and HER-2 negative.  We will go ahead with her third cycle of Herceptin/Perjeta.  She will continue her radiation therapy.  We will plan to get her back to see her in  another 3 weeks.  Marland Kitchen    [name], MD 11/29/20185:48 PM

## 2016-12-21 ENCOUNTER — Ambulatory Visit
Admission: RE | Admit: 2016-12-21 | Discharge: 2016-12-21 | Disposition: A | Discharge status: Home / Self Care | Payer: BC Managed Care – PPO | Source: Ambulatory Visit | Attending: Radiation Oncology | Admitting: Radiation Oncology

## 2016-12-21 DIAGNOSIS — Z510 Encounter for antineoplastic radiation therapy: Secondary | ICD-10-CM | POA: Diagnosis not present

## 2016-12-21 LAB — ESTRADIOL, ULTRA SENS

## 2016-12-24 ENCOUNTER — Ambulatory Visit
Admission: RE | Admit: 2016-12-24 | Discharge: 2016-12-24 | Disposition: A | Discharge status: Home / Self Care | Payer: BC Managed Care – PPO | Source: Ambulatory Visit | Attending: Radiation Oncology | Admitting: Radiation Oncology

## 2016-12-24 DIAGNOSIS — Z510 Encounter for antineoplastic radiation therapy: Secondary | ICD-10-CM | POA: Diagnosis not present

## 2016-12-25 ENCOUNTER — Ambulatory Visit
Admission: RE | Admit: 2016-12-25 | Discharge: 2016-12-25 | Disposition: A | Discharge status: Home / Self Care | Payer: BC Managed Care – PPO | Source: Ambulatory Visit | Attending: Radiation Oncology | Admitting: Radiation Oncology

## 2016-12-25 DIAGNOSIS — C50211 Malignant neoplasm of upper-inner quadrant of right female breast: Secondary | ICD-10-CM | POA: Insufficient documentation

## 2016-12-25 DIAGNOSIS — Z170 Estrogen receptor positive status [ER+]: Secondary | ICD-10-CM | POA: Diagnosis present

## 2016-12-26 ENCOUNTER — Ambulatory Visit
Admission: RE | Admit: 2016-12-26 | Discharge: 2016-12-26 | Disposition: A | Discharge status: Home / Self Care | Payer: BC Managed Care – PPO | Source: Ambulatory Visit | Attending: Radiation Oncology | Admitting: Radiation Oncology

## 2016-12-26 DIAGNOSIS — C50211 Malignant neoplasm of upper-inner quadrant of right female breast: Secondary | ICD-10-CM

## 2016-12-26 DIAGNOSIS — Z170 Estrogen receptor positive status [ER+]: Secondary | ICD-10-CM

## 2016-12-26 MED ORDER — RADIAPLEXRX EX GEL
Freq: Once | CUTANEOUS | Status: AC
  Administered 2016-12-26: 15:00:00 via TOPICAL

## 2016-12-27 ENCOUNTER — Ambulatory Visit
Admission: RE | Admit: 2016-12-27 | Discharge: 2016-12-27 | Disposition: A | Discharge status: Home / Self Care | Payer: BC Managed Care – PPO | Source: Ambulatory Visit | Attending: Radiation Oncology | Admitting: Radiation Oncology

## 2016-12-27 DIAGNOSIS — C50211 Malignant neoplasm of upper-inner quadrant of right female breast: Secondary | ICD-10-CM | POA: Diagnosis not present

## 2016-12-28 ENCOUNTER — Ambulatory Visit
Admission: RE | Admit: 2016-12-28 | Discharge: 2016-12-28 | Disposition: A | Discharge status: Home / Self Care | Payer: BC Managed Care – PPO | Source: Ambulatory Visit | Attending: Radiation Oncology | Admitting: Radiation Oncology

## 2016-12-28 DIAGNOSIS — C50211 Malignant neoplasm of upper-inner quadrant of right female breast: Secondary | ICD-10-CM | POA: Diagnosis not present

## 2016-12-31 ENCOUNTER — Ambulatory Visit: Payer: BC Managed Care – PPO

## 2017-01-01 ENCOUNTER — Ambulatory Visit: Payer: BC Managed Care – PPO

## 2017-01-01 ENCOUNTER — Ambulatory Visit: Payer: BC Managed Care – PPO | Admitting: Radiation Oncology

## 2017-01-01 ENCOUNTER — Ambulatory Visit
Admission: RE | Admit: 2017-01-01 | Discharge: 2017-01-01 | Disposition: A | Discharge status: Home / Self Care | Payer: BC Managed Care – PPO | Source: Ambulatory Visit | Attending: Radiation Oncology | Admitting: Radiation Oncology

## 2017-01-01 DIAGNOSIS — C50211 Malignant neoplasm of upper-inner quadrant of right female breast: Secondary | ICD-10-CM | POA: Diagnosis not present

## 2017-01-02 ENCOUNTER — Ambulatory Visit
Admission: RE | Admit: 2017-01-02 | Discharge: 2017-01-02 | Disposition: A | Discharge status: Home / Self Care | Payer: BC Managed Care – PPO | Source: Ambulatory Visit | Attending: Radiation Oncology | Admitting: Radiation Oncology

## 2017-01-02 DIAGNOSIS — C50211 Malignant neoplasm of upper-inner quadrant of right female breast: Secondary | ICD-10-CM | POA: Diagnosis not present

## 2017-01-02 DIAGNOSIS — C50911 Malignant neoplasm of unspecified site of right female breast: Secondary | ICD-10-CM

## 2017-01-02 DIAGNOSIS — Z170 Estrogen receptor positive status [ER+]: Principal | ICD-10-CM

## 2017-01-03 ENCOUNTER — Ambulatory Visit
Admission: RE | Admit: 2017-01-03 | Discharge: 2017-01-03 | Disposition: A | Discharge status: Home / Self Care | Payer: BC Managed Care – PPO | Source: Ambulatory Visit | Attending: Radiation Oncology | Admitting: Radiation Oncology

## 2017-01-03 DIAGNOSIS — C50211 Malignant neoplasm of upper-inner quadrant of right female breast: Secondary | ICD-10-CM | POA: Diagnosis not present

## 2017-01-04 ENCOUNTER — Ambulatory Visit
Admission: RE | Admit: 2017-01-04 | Discharge: 2017-01-04 | Disposition: A | Discharge status: Home / Self Care | Payer: BC Managed Care – PPO | Source: Ambulatory Visit | Attending: Radiation Oncology | Admitting: Radiation Oncology

## 2017-01-04 DIAGNOSIS — C50211 Malignant neoplasm of upper-inner quadrant of right female breast: Secondary | ICD-10-CM | POA: Diagnosis not present

## 2017-01-07 ENCOUNTER — Ambulatory Visit: Payer: BC Managed Care – PPO

## 2017-01-07 ENCOUNTER — Ambulatory Visit
Admission: RE | Admit: 2017-01-07 | Discharge: 2017-01-07 | Disposition: A | Discharge status: Home / Self Care | Payer: BC Managed Care – PPO | Source: Ambulatory Visit | Attending: Radiation Oncology | Admitting: Radiation Oncology

## 2017-01-07 DIAGNOSIS — C50211 Malignant neoplasm of upper-inner quadrant of right female breast: Secondary | ICD-10-CM | POA: Diagnosis not present

## 2017-01-08 ENCOUNTER — Ambulatory Visit
Admission: RE | Admit: 2017-01-08 | Discharge: 2017-01-08 | Disposition: A | Discharge status: Home / Self Care | Payer: BC Managed Care – PPO | Source: Ambulatory Visit | Attending: Radiation Oncology | Admitting: Radiation Oncology

## 2017-01-08 ENCOUNTER — Ambulatory Visit: Payer: BC Managed Care – PPO

## 2017-01-08 ENCOUNTER — Encounter: Payer: Self-pay | Admitting: Oncology

## 2017-01-08 DIAGNOSIS — C50211 Malignant neoplasm of upper-inner quadrant of right female breast: Secondary | ICD-10-CM | POA: Diagnosis not present

## 2017-01-09 ENCOUNTER — Ambulatory Visit
Admission: RE | Admit: 2017-01-09 | Discharge: 2017-01-09 | Disposition: A | Discharge status: Home / Self Care | Payer: BC Managed Care – PPO | Source: Ambulatory Visit | Attending: Radiation Oncology | Admitting: Radiation Oncology

## 2017-01-09 ENCOUNTER — Other Ambulatory Visit: Payer: Self-pay

## 2017-01-09 ENCOUNTER — Other Ambulatory Visit (HOSPITAL_BASED_OUTPATIENT_CLINIC_OR_DEPARTMENT_OTHER): Payer: BC Managed Care – PPO

## 2017-01-09 ENCOUNTER — Ambulatory Visit (HOSPITAL_BASED_OUTPATIENT_CLINIC_OR_DEPARTMENT_OTHER): Payer: BC Managed Care – PPO

## 2017-01-09 ENCOUNTER — Ambulatory Visit (HOSPITAL_BASED_OUTPATIENT_CLINIC_OR_DEPARTMENT_OTHER): Payer: BC Managed Care – PPO | Admitting: Hematology & Oncology

## 2017-01-09 ENCOUNTER — Encounter: Payer: Self-pay | Admitting: Hematology & Oncology

## 2017-01-09 ENCOUNTER — Encounter: Payer: Self-pay | Admitting: *Deleted

## 2017-01-09 ENCOUNTER — Ambulatory Visit: Payer: BC Managed Care – PPO

## 2017-01-09 DIAGNOSIS — C773 Secondary and unspecified malignant neoplasm of axilla and upper limb lymph nodes: Secondary | ICD-10-CM | POA: Diagnosis not present

## 2017-01-09 DIAGNOSIS — Z5112 Encounter for antineoplastic immunotherapy: Secondary | ICD-10-CM

## 2017-01-09 DIAGNOSIS — C50911 Malignant neoplasm of unspecified site of right female breast: Secondary | ICD-10-CM

## 2017-01-09 DIAGNOSIS — C50211 Malignant neoplasm of upper-inner quadrant of right female breast: Secondary | ICD-10-CM | POA: Diagnosis not present

## 2017-01-09 DIAGNOSIS — T386X5A Adverse effect of antigonadotrophins, antiestrogens, antiandrogens, not elsewhere classified, initial encounter: Secondary | ICD-10-CM

## 2017-01-09 DIAGNOSIS — Z170 Estrogen receptor positive status [ER+]: Principal | ICD-10-CM

## 2017-01-09 DIAGNOSIS — M818 Other osteoporosis without current pathological fracture: Secondary | ICD-10-CM

## 2017-01-09 LAB — CBC WITH DIFFERENTIAL (CANCER CENTER ONLY)
BASO#: 0 10*3/uL (ref 0.0–0.2)
BASO%: 0.3 % (ref 0.0–2.0)
EOS ABS: 0.1 10*3/uL (ref 0.0–0.5)
EOS%: 1.9 % (ref 0.0–7.0)
HCT: 32.5 % — ABNORMAL LOW (ref 34.8–46.6)
HGB: 10.7 g/dL — ABNORMAL LOW (ref 11.6–15.9)
LYMPH#: 0.7 10*3/uL — ABNORMAL LOW (ref 0.9–3.3)
LYMPH%: 17.3 % (ref 14.0–48.0)
MCH: 27.9 pg (ref 26.0–34.0)
MCHC: 32.9 g/dL (ref 32.0–36.0)
MCV: 85 fL (ref 81–101)
MONO#: 0.2 10*3/uL (ref 0.1–0.9)
MONO%: 6.1 % (ref 0.0–13.0)
NEUT%: 74.4 % (ref 39.6–80.0)
NEUTROS ABS: 2.8 10*3/uL (ref 1.5–6.5)
PLATELETS: 204 10*3/uL (ref 145–400)
RBC: 3.83 10*6/uL (ref 3.70–5.32)
RDW: 14.2 % (ref 11.1–15.7)
WBC: 3.8 10*3/uL — ABNORMAL LOW (ref 3.9–10.0)

## 2017-01-09 LAB — CMP (CANCER CENTER ONLY)
ALT(SGPT): 26 U/L (ref 10–47)
AST: 21 U/L (ref 11–38)
Albumin: 3.4 g/dL (ref 3.3–5.5)
Alkaline Phosphatase: 68 U/L (ref 26–84)
BILIRUBIN TOTAL: 0.6 mg/dL (ref 0.20–1.60)
BUN: 7 mg/dL (ref 7–22)
CHLORIDE: 105 meq/L (ref 98–108)
CO2: 29 meq/L (ref 18–33)
CREATININE: 0.9 mg/dL (ref 0.6–1.2)
Calcium: 9.1 mg/dL (ref 8.0–10.3)
GLUCOSE: 124 mg/dL — AB (ref 73–118)
Potassium: 3.4 mEq/L (ref 3.3–4.7)
SODIUM: 144 meq/L (ref 128–145)
Total Protein: 6.7 g/dL (ref 6.4–8.1)

## 2017-01-09 LAB — LACTATE DEHYDROGENASE: LDH: 166 U/L (ref 125–245)

## 2017-01-09 MED ORDER — LETROZOLE 2.5 MG PO TABS: 2.5000 mg | ORAL_TABLET | Freq: Every day | ORAL | 12 refills | Status: DC

## 2017-01-09 MED ORDER — SODIUM CHLORIDE 0.9 % IV SOLN
450.0000 mg | Freq: Once | INTRAVENOUS | Status: AC
  Administered 2017-01-09: 450 mg via INTRAVENOUS
  Filled 2017-01-09: qty 21.43

## 2017-01-09 MED ORDER — DIPHENHYDRAMINE HCL 25 MG PO CAPS
ORAL_CAPSULE | ORAL | Status: AC
  Filled 2017-01-09: qty 2

## 2017-01-09 MED ORDER — SODIUM CHLORIDE 0.9% FLUSH
10.0000 mL | INTRAVENOUS | Status: DC | PRN
  Administered 2017-01-09: 10 mL
  Filled 2017-01-09: qty 10

## 2017-01-09 MED ORDER — ACETAMINOPHEN 325 MG PO TABS
ORAL_TABLET | ORAL | Status: AC
  Filled 2017-01-09: qty 2

## 2017-01-09 MED ORDER — SODIUM CHLORIDE 0.9 % IV SOLN
Freq: Once | INTRAVENOUS | Status: AC
  Administered 2017-01-09: 12:00:00 via INTRAVENOUS

## 2017-01-09 MED ORDER — DIPHENHYDRAMINE HCL 25 MG PO CAPS
50.0000 mg | ORAL_CAPSULE | Freq: Once | ORAL | Status: AC
  Administered 2017-01-09: 50 mg via ORAL

## 2017-01-09 MED ORDER — HEPARIN SOD (PORK) LOCK FLUSH 100 UNIT/ML IV SOLN
500.0000 [IU] | Freq: Once | INTRAVENOUS | Status: AC | PRN
  Administered 2017-01-09: 500 [IU]
  Filled 2017-01-09: qty 5

## 2017-01-09 MED ORDER — PERTUZUMAB CHEMO INJECTION 420 MG/14ML
420.0000 mg | Freq: Once | INTRAVENOUS | Status: AC
  Administered 2017-01-09: 420 mg via INTRAVENOUS
  Filled 2017-01-09: qty 14

## 2017-01-09 MED ORDER — ACETAMINOPHEN 325 MG PO TABS
650.0000 mg | ORAL_TABLET | Freq: Once | ORAL | Status: AC
  Administered 2017-01-09: 650 mg via ORAL

## 2017-01-09 NOTE — Progress Notes (Signed)
Hematology and Oncology Follow Up Visit  TAMMRA PRESSMAN [id number] [birth year] 51 y.o. 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 Schelling is back for follow-up. I had her come in today because on the final path report for her breast cancer ([id number]) she was POSITIVE for HER-2/neu. Her score was 2.32.   She is almost done with radiation.  She will finish her radiation next week.  She had a little bit of dermatitis from the radiation.  Overall, the radiation really has not been too bad.  She wants go back to work.  I do not see any problems with her going back to work.  We wrote her a letter to allow her to go back to work full-time.  I talked to her about starting tomorrow.  I think Femara would be very reasonable now that radiation will be done.  I talked her about the side effects of Femara.  I explained to her about the arthralgias and myalgias.  I told her that she can take vitamin D for this.  I recommended 2000 units of vitamin D.  I also talked to her about Prolia.  I think Prolia would not be a bad idea given her 3+ lymph nodes.  We would do this every 6 months.  Overall, her performance status is ECOG 1.    Medications:  Current Outpatient Medications:  .  HYDROcodone-acetaminophen (NORCO/VICODIN) 5-325 MG tablet, Take 1-2 tablets by mouth every 4 (four) hours as needed for moderate pain., Disp: 30 tablet, Rfl: 0 .  valACYclovir (VALTREX) 1000 MG tablet, Take 1,000 mg by mouth daily as needed. , Disp: , Rfl:   Allergies: No Known Allergies  Past Medical History, Surgical history, Social  history, and Family History were reviewed and updated.  Review of Systems: Review of Systems - Oncology  Physical Exam:  vitals were not taken for this visit.   Wt Readings from Last 3 Encounters:  01/09/17 167 lb (75.8 kg)  12/19/16 164 lb (74.4 kg)  11/21/16 163 lb (73.9 kg)    Physical Exam   Well-developed and well-nourished white female in no obvious distress. Vital signs show a temperature of 98.7. Pulse 100. Blood pressure 130/82. Weight is 161 pounds. Head and neck exam shows no ocular or oral lesions. There are no palpable cervical or supraclavicular lymph nodes. Lungs are clear bilaterally. Cardiac exam regular rate and rhythm with no murmurs, rubs or bruits. Breast exam shows left breast no masses, edema or erythema. There is no left axillary adenopathy. Right breast is somewhat contracted from  surgery. She has the healing lumpectomy scar at a 3:00 position. This is healing. There is some slight erythema at the lumpectomy site. No distinct masses noted in the right breast. The lymphadenectomy scar is healing in the right axilla. Abdomen is soft. She has good bowel sounds. There is no fluid wave. There is no palpable liver or spleen tip. Back exam shows no tenderness over the spine, ribs or hips. Extremities shows no clubbing, cyanosis or edema. There is no lymphedema of the right arm.  Neurological exam shows no focal neurological deficits.  Lab Results  Component Value Date   WBC 3.8 (L) 01/09/2017   HGB 10.7 (L) 01/09/2017   HCT 32.5 (L) 01/09/2017   MCV 85 01/09/2017   PLT 204 01/09/2017     Chemistry      Component Value Date/Time   NA 144 01/09/2017 0927   K 3.4 01/09/2017 0927   CL 105 01/09/2017 0927   CO2 29 01/09/2017 0927   BUN 7 01/09/2017 0927   CREATININE 0.9 01/09/2017 0927      Component Value Date/Time   CALCIUM 9.1 01/09/2017 0927   ALKPHOS 68 01/09/2017 0927   AST 21 01/09/2017 0927   ALT 26 01/09/2017 0927   BILITOT 0.60 01/09/2017 0927          Impression and Plan: Ms. Laubach is a 51 year old postmenopausal white female. She has a stage IIIA invasive ductal carcinoma of the right breast. This tumor is ER positive and HER-2 negative.  We will go ahead with her 4th cycle of Herceptin/Perjeta.  I spent about 30 minutes with her.  I talked to her about the Femara and the Prolia.  She is agreeable to both.    We will plan to get her back to see her in another 3 weeks.  Marland Kitchen    [name], MD 12/19/201810:53 AM

## 2017-01-09 NOTE — Patient Instructions (Signed)
[facility] Discharge Instructions for Patients Receiving Chemotherapy  Today you received the following chemotherapy agents Herceptin, Perjeta.  To help prevent nausea and vomiting after your treatment, we encourage you to take your nausea medication as indicated by your MD.   If you develop nausea and vomiting that is not controlled by your nausea medication, call the clinic.   BELOW ARE SYMPTOMS THAT SHOULD BE REPORTED IMMEDIATELY:  *FEVER GREATER THAN 100.5 F  *CHILLS WITH OR WITHOUT FEVER  NAUSEA AND VOMITING THAT IS NOT CONTROLLED WITH YOUR NAUSEA MEDICATION  *UNUSUAL SHORTNESS OF BREATH  *UNUSUAL BRUISING OR BLEEDING  TENDERNESS IN MOUTH AND THROAT WITH OR WITHOUT PRESENCE OF ULCERS  *URINARY PROBLEMS  *BOWEL PROBLEMS  UNUSUAL RASH Items with * indicate a potential emergency and should be followed up as soon as possible.  Feel free to call the clinic should you have any questions or concerns. The clinic phone number is [phone].  Please show the CHEMO ALERT CARD at check-in to the Emergency Department and triage nurse.   

## 2017-01-10 ENCOUNTER — Ambulatory Visit
Admission: RE | Admit: 2017-01-10 | Discharge: 2017-01-10 | Disposition: A | Discharge status: Home / Self Care | Payer: BC Managed Care – PPO | Source: Ambulatory Visit | Attending: Radiation Oncology | Admitting: Radiation Oncology

## 2017-01-10 ENCOUNTER — Ambulatory Visit: Payer: BC Managed Care – PPO

## 2017-01-10 DIAGNOSIS — C50211 Malignant neoplasm of upper-inner quadrant of right female breast: Secondary | ICD-10-CM | POA: Diagnosis not present

## 2017-01-10 LAB — VITAMIN D 25 HYDROXY (VIT D DEFICIENCY, FRACTURES): VIT D 25 HYDROXY: 18.2 ng/mL — AB (ref 30.0–100.0)

## 2017-01-11 ENCOUNTER — Ambulatory Visit
Admission: RE | Admit: 2017-01-11 | Discharge: 2017-01-11 | Disposition: A | Discharge status: Home / Self Care | Payer: BC Managed Care – PPO | Source: Ambulatory Visit | Attending: Radiation Oncology | Admitting: Radiation Oncology

## 2017-01-11 ENCOUNTER — Ambulatory Visit: Payer: BC Managed Care – PPO

## 2017-01-11 ENCOUNTER — Telehealth: Payer: Self-pay | Admitting: *Deleted

## 2017-01-11 DIAGNOSIS — C50211 Malignant neoplasm of upper-inner quadrant of right female breast: Secondary | ICD-10-CM | POA: Diagnosis not present

## 2017-01-11 NOTE — Telephone Encounter (Addendum)
Patient is aware of results. She will start supplement  ----- Message from [name], MD sent at 01/10/2017  6:34 AM EST ----- Call - the Vit D level is low!! Make sure you are taking 2000 units a day at the least!!  pete

## 2017-01-14 ENCOUNTER — Ambulatory Visit
Admission: RE | Admit: 2017-01-14 | Discharge: 2017-01-14 | Disposition: A | Discharge status: Home / Self Care | Payer: BC Managed Care – PPO | Source: Ambulatory Visit | Attending: Radiation Oncology | Admitting: Radiation Oncology

## 2017-01-14 ENCOUNTER — Ambulatory Visit: Payer: BC Managed Care – PPO

## 2017-01-14 DIAGNOSIS — C50211 Malignant neoplasm of upper-inner quadrant of right female breast: Secondary | ICD-10-CM | POA: Diagnosis not present

## 2017-01-23 ENCOUNTER — Other Ambulatory Visit: Payer: Self-pay | Admitting: Family

## 2017-01-23 DIAGNOSIS — Z170 Estrogen receptor positive status [ER+]: Principal | ICD-10-CM

## 2017-01-23 DIAGNOSIS — C50911 Malignant neoplasm of unspecified site of right female breast: Secondary | ICD-10-CM

## 2017-01-24 ENCOUNTER — Encounter: Payer: Self-pay | Admitting: Radiation Oncology

## 2017-01-24 NOTE — Progress Notes (Signed)
  Radiation Oncology         [phone] ________________________________  Name: Christina Joseph MRN: [id number]  Date: 01/24/2017  DOB: 06/23/1965  End of Treatment Note  Diagnosis:   Pathologic stage IIIA (ypT1c, ypN1a) 1.9 cm  invasive ductal carcinoma of the right breast, ER(95%), PR(50%), HER2(+) with ratio of 2.32 of HER2/Cep17 signals, Ki67 2%     Indication for treatment:  Curative, breast conservation therapy       Radiation treatment dates:   11/27/16-01/14/17  Site/dose:   Right breast / 50.4 Gy delivered in 28 fractions Right axilla / 45 Gy delivered in 25 fractions Boost / 10 Gy delivered in 5 fractions  Beams/energy:   1. Right breast 3D / 6X, 10X         2. Right axilla 3D/ 10X         3. Boost 3D / 15E  Narrative: The patient tolerated radiation treatment relatively well. Initially she was asymptomatic. Throughout the course of treatment, the patient developed some erythema of the skin with peeling which was controlled by Radiaplex. Otherwise, she is without acute complaints and denied fatigue, chest pain, headache or shortness of breath.  Plan: The patient has completed radiation treatment. The patient will return to radiation oncology clinic for routine followup in one month. I advised them to call or return sooner if they have any questions or concerns related to their recovery or treatment.  -----------------------------------  Blair Promise, PhD, MD  This document serves as a record of services personally performed by Gery Pray, MD. It was created on his behalf by Marlowe Kays, a trained medical scribe. The creation of this record is based on the scribe's personal observations and the provider's statements to them. This document has been checked and approved by the attending provider.

## 2017-01-30 ENCOUNTER — Inpatient Hospital Stay: Payer: BC Managed Care – PPO

## 2017-01-30 ENCOUNTER — Encounter: Payer: Self-pay | Admitting: *Deleted

## 2017-01-30 ENCOUNTER — Inpatient Hospital Stay: Payer: BC Managed Care – PPO | Attending: Hematology & Oncology | Admitting: Hematology & Oncology

## 2017-01-30 ENCOUNTER — Other Ambulatory Visit: Payer: Self-pay

## 2017-01-30 VITALS — BP 128/70 | HR 90 | Temp 98.0°F | Wt 166.0 lb

## 2017-01-30 DIAGNOSIS — C773 Secondary and unspecified malignant neoplasm of axilla and upper limb lymph nodes: Secondary | ICD-10-CM | POA: Diagnosis not present

## 2017-01-30 DIAGNOSIS — M818 Other osteoporosis without current pathological fracture: Secondary | ICD-10-CM

## 2017-01-30 DIAGNOSIS — E559 Vitamin D deficiency, unspecified: Secondary | ICD-10-CM | POA: Diagnosis not present

## 2017-01-30 DIAGNOSIS — Z79811 Long term (current) use of aromatase inhibitors: Secondary | ICD-10-CM

## 2017-01-30 DIAGNOSIS — C50211 Malignant neoplasm of upper-inner quadrant of right female breast: Secondary | ICD-10-CM

## 2017-01-30 DIAGNOSIS — C50911 Malignant neoplasm of unspecified site of right female breast: Secondary | ICD-10-CM

## 2017-01-30 DIAGNOSIS — Z170 Estrogen receptor positive status [ER+]: Principal | ICD-10-CM

## 2017-01-30 DIAGNOSIS — T386X5A Adverse effect of antigonadotrophins, antiestrogens, antiandrogens, not elsewhere classified, initial encounter: Secondary | ICD-10-CM

## 2017-01-30 DIAGNOSIS — Z5112 Encounter for antineoplastic immunotherapy: Secondary | ICD-10-CM | POA: Insufficient documentation

## 2017-01-30 LAB — CMP (CANCER CENTER ONLY)
ALBUMIN: 3.4 g/dL — AB (ref 3.5–5.0)
ALT: 27 U/L (ref 0–55)
AST: 21 U/L (ref 5–34)
Alkaline Phosphatase: 61 U/L (ref 40–150)
Anion gap: 13 (ref 5–15)
BUN: 12 mg/dL (ref 7–26)
CALCIUM: 9.4 mg/dL (ref 8.4–10.4)
CHLORIDE: 105 mmol/L (ref 98–109)
CO2: 28 mmol/L (ref 22–29)
CREATININE: 0.9 mg/dL (ref 0.70–1.30)
GFR, Est AFR Am: >60 mL/min (ref >60)
GFR, Estimated: >60 mL/min (ref >60)
GLUCOSE: 110 mg/dL (ref 70–140)
Potassium: 3.9 mmol/L (ref 3.3–4.7)
SODIUM: 146 mmol/L — AB (ref 136–145)
Total Bilirubin: 0.7 mg/dL (ref 0.2–1.2)
Total Protein: 6.7 g/dL (ref 6.4–8.3)

## 2017-01-30 LAB — CBC WITH DIFFERENTIAL (CANCER CENTER ONLY)
Abs Granulocyte: 3.4 10*3/uL (ref 1.5–6.5)
BASOS ABS: 0 10*3/uL (ref 0.0–0.1)
Basophils Relative: 1 %
EOS ABS: 0.1 10*3/uL (ref 0.0–0.5)
Eosinophils Relative: 1 %
HCT: 32.5 % — ABNORMAL LOW (ref 34.8–46.6)
HEMOGLOBIN: 10.5 g/dL — AB (ref 11.6–15.9)
LYMPHS ABS: 0.6 10*3/uL — AB (ref 0.9–3.3)
LYMPHS PCT: 14 %
MCH: 27.9 pg (ref 26.0–34.0)
MCHC: 32.3 g/dL (ref 32.0–36.0)
MCV: 86.2 fL (ref 81.0–101.0)
MONOS PCT: 6 %
Monocytes Absolute: 0.3 10*3/uL (ref 0.1–0.9)
NEUTROS ABS: 3.4 10*3/uL (ref 1.5–6.5)
Neutrophils Relative %: 78 %
Platelet Count: 229 10*3/uL (ref 145–400)
RBC: 3.77 MIL/uL (ref 3.70–5.32)
RDW: 14.9 % (ref 11.1–15.7)
WBC Count: 4.4 10*3/uL (ref 4.0–10.3)

## 2017-01-30 LAB — LACTATE DEHYDROGENASE: LDH: 192 U/L (ref 125–245)

## 2017-01-30 MED ORDER — DIPHENHYDRAMINE HCL 25 MG PO CAPS
50.0000 mg | ORAL_CAPSULE | Freq: Once | ORAL | Status: AC
Start: 2017-01-30 — End: 2017-01-30
  Administered 2017-01-30: 50 mg via ORAL

## 2017-01-30 MED ORDER — HEPARIN SOD (PORK) LOCK FLUSH 100 UNIT/ML IV SOLN
500.0000 [IU] | Freq: Once | INTRAVENOUS | Status: AC | PRN
  Administered 2017-01-30: 500 [IU]
  Filled 2017-01-30: qty 5

## 2017-01-30 MED ORDER — ACETAMINOPHEN 325 MG PO TABS
650.0000 mg | ORAL_TABLET | Freq: Once | ORAL | Status: AC
  Administered 2017-01-30: 650 mg via ORAL

## 2017-01-30 MED ORDER — ACETAMINOPHEN 325 MG PO TABS
ORAL_TABLET | ORAL | Status: AC
  Filled 2017-01-30: qty 2

## 2017-01-30 MED ORDER — DENOSUMAB 60 MG/ML ~~LOC~~ SOLN
60.0000 mg | Freq: Once | SUBCUTANEOUS | Status: AC
  Administered 2017-01-30: 60 mg via SUBCUTANEOUS
  Filled 2017-01-30: qty 1

## 2017-01-30 MED ORDER — SODIUM CHLORIDE 0.9 % IV SOLN
450.0000 mg | Freq: Once | INTRAVENOUS | Status: AC
  Administered 2017-01-30: 450 mg via INTRAVENOUS
  Filled 2017-01-30: qty 21.43

## 2017-01-30 MED ORDER — SODIUM CHLORIDE 0.9% FLUSH
10.0000 mL | INTRAVENOUS | Status: DC | PRN
  Administered 2017-01-30: 10 mL
  Filled 2017-01-30: qty 10

## 2017-01-30 MED ORDER — DIPHENHYDRAMINE HCL 25 MG PO CAPS
ORAL_CAPSULE | ORAL | Status: AC
  Filled 2017-01-30: qty 2

## 2017-01-30 MED ORDER — SODIUM CHLORIDE 0.9 % IV SOLN
Freq: Once | INTRAVENOUS | Status: AC
  Administered 2017-01-30: 14:00:00 via INTRAVENOUS

## 2017-01-30 MED ORDER — SODIUM CHLORIDE 0.9 % IV SOLN
420.0000 mg | Freq: Once | INTRAVENOUS | Status: AC
  Administered 2017-01-30: 420 mg via INTRAVENOUS
  Filled 2017-01-30: qty 14

## 2017-01-30 NOTE — Patient Instructions (Signed)
[facility] Discharge Instructions for Patients Receiving Chemotherapy  Today you received the following chemotherapy agents Herceptin, Prolia  To help prevent nausea and vomiting after your treatment, we encourage you to take your nausea medication    If you develop nausea and vomiting that is not controlled by your nausea medication, call the clinic.   BELOW ARE SYMPTOMS THAT SHOULD BE REPORTED IMMEDIATELY:  *FEVER GREATER THAN 100.5 F  *CHILLS WITH OR WITHOUT FEVER  NAUSEA AND VOMITING THAT IS NOT CONTROLLED WITH YOUR NAUSEA MEDICATION  *UNUSUAL SHORTNESS OF BREATH  *UNUSUAL BRUISING OR BLEEDING  TENDERNESS IN MOUTH AND THROAT WITH OR WITHOUT PRESENCE OF ULCERS  *URINARY PROBLEMS  *BOWEL PROBLEMS  UNUSUAL RASH Items with * indicate a potential emergency and should be followed up as soon as possible.  Feel free to call the clinic should you have any questions or concerns. The clinic phone number is [phone].  Please show the [facility] at check-in to the Emergency Department and triage nurse.

## 2017-01-30 NOTE — Patient Instructions (Signed)
Implanted Port Insertion, Care After °This sheet gives you information about how to care for yourself after your procedure. Your health care provider may also give you more specific instructions. If you have problems or questions, contact your health care provider. °What can I expect after the procedure? °After your procedure, it is common to have: °· Discomfort at the port insertion site. °· Bruising on the skin over the port. This should improve over 3-4 days. ° °Follow these instructions at home: °Port care °· After your port is placed, you will get a manufacturer's information card. The card has information about your port. Keep this card with you at all times. °· Take care of the port as told by your health care provider. Ask your health care provider if you or a family member can get training for taking care of the port at home. A home health care nurse may also take care of the port. °· Make sure to remember what type of port you have. °Incision care °· Follow instructions from your health care provider about how to take care of your port insertion site. Make sure you: °? Wash your hands with soap and water before you change your bandage (dressing). If soap and water are not available, use hand sanitizer. °? Change your dressing as told by your health care provider. °? Leave stitches (sutures), skin glue, or adhesive strips in place. These skin closures may need to stay in place for 2 weeks or longer. If adhesive strip edges start to loosen and curl up, you may trim the loose edges. Do not remove adhesive strips completely unless your health care provider tells you to do that. °· Check your port insertion site every day for signs of infection. Check for: °? More redness, swelling, or pain. °? More fluid or blood. °? Warmth. °? Pus or a bad smell. °General instructions °· Do not take baths, swim, or use a hot tub until your health care provider approves. °· Do not lift anything that is heavier than 10 lb (4.5  kg) for a week, or as told by your health care provider. °· Ask your health care provider when it is okay to: °? Return to work or school. °? Resume usual physical activities or sports. °· Do not drive for 24 hours if you were given a medicine to help you relax (sedative). °· Take over-the-counter and prescription medicines only as told by your health care provider. °· Wear a medical alert bracelet in case of an emergency. This will tell any health care providers that you have a port. °· Keep all follow-up visits as told by your health care provider. This is important. °Contact a health care provider if: °· You cannot flush your port with saline as directed, or you cannot draw blood from the port. °· You have a fever or chills. °· You have more redness, swelling, or pain around your port insertion site. °· You have more fluid or blood coming from your port insertion site. °· Your port insertion site feels warm to the touch. °· You have pus or a bad smell coming from the port insertion site. °Get help right away if: °· You have chest pain or shortness of breath. °· You have bleeding from your port that you cannot control. °Summary °· Take care of the port as told by your health care provider. °· Change your dressing as told by your health care provider. °· Keep all follow-up visits as told by your health care provider. °  This information is not intended to replace advice given to you by your health care provider. Make sure you discuss any questions you have with your health care provider. °Document Released: 10/29/2012 Document Revised: 11/30/2015 Document Reviewed: 11/30/2015 °Elsevier Interactive Patient Education © 2017 [employer] Inc[employer] ° °

## 2017-01-30 NOTE — Progress Notes (Signed)
Hematology and Oncology Follow Up Visit  Christina Joseph [id number] [birth year] 52 y.o. 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 I had her come in today because on the final path report for her breast cancer ([id number]) she was POSITIVE for HER-2/neu. Her score was 2.32.   She is almost done with radiation.  She will finish her radiation next week.  She had a little bit of dermatitis from the radiation.  Overall, the radiation really has not been too bad.  She wants go back to work.  I do not see any problems with her going back to work.  We wrote her a letter to allow her to go back to work full-time.  I talked to her about starting tomorrow.  I think Femara would be very reasonable now that radiation will be done.  I talked her about the side effects of Femara.  I explained to her about the arthralgias and myalgias.  I told her that she can take vitamin D for this.  I recommended 2000 units of vitamin D.  I also talked to her about Prolia.  I think Prolia would not be a bad idea given her 3+ lymph nodes.  We would do this every 6 months.  Overall, her performance status is ECOG 1.    Medications:  Current Outpatient Medications:  .  HYDROcodone-acetaminophen (NORCO/VICODIN) 5-325 MG tablet, Take 1-2 tablets by mouth every 4 (four) hours as needed for moderate pain., Disp: 30 tablet, Rfl: 0 .  letrozole (FEMARA) 2.5 MG tablet, Take 1 tablet (2.5 mg total) by mouth daily., Disp: 30 tablet, Rfl: 12 .  valACYclovir (VALTREX) 1000 MG tablet, Take 1,000 mg by mouth daily as  needed. , Disp: , Rfl:   Allergies: No Known Allergies  Past Medical History, Surgical history, Social history, and Family History were reviewed and updated.  Review of Systems: Review of Systems - Oncology  Physical Exam:  vitals were not taken for this visit.   Wt Readings from Last 3 Encounters:  01/30/17 166 lb (75.3 kg)  01/09/17 167 lb (75.8 kg)  12/19/16 164 lb (74.4 kg)    Physical Exam   Well-developed and well-nourished white female in no obvious distress. Vital signs show a temperature of 98.7. Pulse 100. Blood pressure 130/82. Weight is 161 pounds. Head and neck exam shows no ocular or oral lesions. There are no palpable cervical or supraclavicular lymph nodes. Lungs are clear bilaterally. Cardiac exam regular rate and rhythm with no murmurs, rubs or bruits. Breast exam shows left breast no masses, edema or erythema. There is no left axillary adenopathy. Right breast is somewhat contracted from  surgery. She has the healing lumpectomy scar at a 3:00 position. This is healing. There is some slight erythema at the lumpectomy site. No distinct masses noted in the right breast. The lymphadenectomy scar is healing in the right axilla. Abdomen is soft. She has good bowel sounds. There is no fluid wave. There is no palpable liver or spleen tip. Back exam shows no tenderness  over the spine, ribs or hips. Extremities shows no clubbing, cyanosis or edema. There is no lymphedema of the right arm. Neurological exam shows no focal neurological deficits.  Lab Results  Component Value Date   WBC 3.8 (L) 01/09/2017   HGB 10.7 (L) 01/09/2017   HCT 32.5 (L) 01/30/2017   MCV 86.2 01/30/2017   PLT 204 01/09/2017     Chemistry      Component Value Date/Time   NA 146 (H) 01/30/2017 0848   NA 144 01/09/2017 0927   K 3.9 01/30/2017 0848   K 3.4 01/09/2017 0927   CL 105 01/30/2017 0848   CL 105 01/09/2017 0927   CO2 28 01/30/2017 0848   CO2 29 01/09/2017 0927   BUN 12 01/30/2017 0848    BUN 7 01/09/2017 0927   CREATININE 0.9 01/09/2017 0927      Component Value Date/Time   CALCIUM 9.4 01/30/2017 0848   CALCIUM 9.1 01/09/2017 0927   ALKPHOS 61 01/30/2017 0848   ALKPHOS 68 01/09/2017 0927   AST 21 01/30/2017 0848   ALT 27 01/30/2017 0848   ALT 26 01/09/2017 0927   BILITOT 0.7 01/30/2017 0848         Impression and Plan: Christina Joseph is a 52 year old postmenopausal white female. She has a stage IIIA invasive ductal carcinoma of the right breast. This tumor is ER positive and HER-2 negative.  We will go ahead with her 4th cycle of Herceptin/Perjeta.  I spent about 30 minutes with her.  I talked to her about the Femara and the Prolia.  She is agreeable to both.    We will plan to get her back to see her in another 3 weeks.  Marland Kitchen    [name], MD 1/9/201910:32 AM

## 2017-02-14 ENCOUNTER — Ambulatory Visit: Payer: BC Managed Care – PPO | Admitting: Radiation Oncology

## 2017-02-20 ENCOUNTER — Inpatient Hospital Stay: Payer: BC Managed Care – PPO

## 2017-02-20 ENCOUNTER — Other Ambulatory Visit: Payer: Self-pay

## 2017-02-20 ENCOUNTER — Inpatient Hospital Stay (HOSPITAL_BASED_OUTPATIENT_CLINIC_OR_DEPARTMENT_OTHER): Payer: BC Managed Care – PPO | Admitting: Family

## 2017-02-20 ENCOUNTER — Encounter: Payer: Self-pay | Admitting: Family

## 2017-02-20 DIAGNOSIS — T386X5A Adverse effect of antigonadotrophins, antiestrogens, antiandrogens, not elsewhere classified, initial encounter: Secondary | ICD-10-CM

## 2017-02-20 DIAGNOSIS — C50911 Malignant neoplasm of unspecified site of right female breast: Secondary | ICD-10-CM

## 2017-02-20 DIAGNOSIS — Z170 Estrogen receptor positive status [ER+]: Secondary | ICD-10-CM

## 2017-02-20 DIAGNOSIS — E559 Vitamin D deficiency, unspecified: Secondary | ICD-10-CM | POA: Diagnosis not present

## 2017-02-20 DIAGNOSIS — Z5112 Encounter for antineoplastic immunotherapy: Secondary | ICD-10-CM | POA: Diagnosis not present

## 2017-02-20 DIAGNOSIS — Z79811 Long term (current) use of aromatase inhibitors: Secondary | ICD-10-CM | POA: Diagnosis not present

## 2017-02-20 DIAGNOSIS — M818 Other osteoporosis without current pathological fracture: Secondary | ICD-10-CM

## 2017-02-20 LAB — CBC WITH DIFFERENTIAL (CANCER CENTER ONLY)
BASOS ABS: 0 10*3/uL (ref 0.0–0.1)
BASOS PCT: 1 %
EOS ABS: 0.1 10*3/uL (ref 0.0–0.5)
EOS PCT: 1 %
HCT: 33.1 % — ABNORMAL LOW (ref 34.8–46.6)
Hemoglobin: 10.7 g/dL — ABNORMAL LOW (ref 11.6–15.9)
LYMPHS PCT: 23 %
Lymphs Abs: 1.4 10*3/uL (ref 0.9–3.3)
MCH: 28 pg (ref 26.0–34.0)
MCHC: 32.3 g/dL (ref 32.0–36.0)
MCV: 86.6 fL (ref 81.0–101.0)
MONO ABS: 0.3 10*3/uL (ref 0.1–0.9)
Monocytes Relative: 5 %
Neutro Abs: 4.4 10*3/uL (ref 1.5–6.5)
Neutrophils Relative %: 70 %
PLATELETS: 241 10*3/uL (ref 145–400)
RBC: 3.82 MIL/uL (ref 3.70–5.32)
RDW: 15 % (ref 11.1–15.7)
WBC: 6.3 10*3/uL (ref 3.9–10.3)

## 2017-02-20 LAB — CMP (CANCER CENTER ONLY)
ALK PHOS: 77 U/L (ref 26–84)
ALT: 22 U/L (ref 0–55)
AST: 26 U/L (ref 5–34)
Albumin: 4 g/dL (ref 3.5–5.0)
Anion gap: 8 (ref 5–15)
BUN: 17 mg/dL (ref 7–22)
CALCIUM: 9.4 mg/dL (ref 8.0–10.3)
CO2: 25 mmol/L (ref 18–33)
CREATININE: 0.6 mg/dL (ref 0.60–1.10)
Chloride: 108 mmol/L (ref 98–108)
GLUCOSE: 94 mg/dL (ref 73–118)
POTASSIUM: 3.8 mmol/L (ref 3.3–4.7)
SODIUM: 141 mmol/L (ref 128–145)
TOTAL PROTEIN: 7.3 g/dL (ref 6.4–8.1)
Total Bilirubin: 0.5 mg/dL (ref 0.2–1.2)

## 2017-02-20 MED ORDER — ACETAMINOPHEN 325 MG PO TABS
650.0000 mg | ORAL_TABLET | Freq: Once | ORAL | Status: AC
  Administered 2017-02-20: 650 mg via ORAL

## 2017-02-20 MED ORDER — SODIUM CHLORIDE 0.9% FLUSH
10.0000 mL | INTRAVENOUS | Status: DC | PRN
  Administered 2017-02-20: 10 mL
  Filled 2017-02-20: qty 10

## 2017-02-20 MED ORDER — DIPHENHYDRAMINE HCL 25 MG PO CAPS
50.0000 mg | ORAL_CAPSULE | Freq: Once | ORAL | Status: AC
  Administered 2017-02-20: 50 mg via ORAL

## 2017-02-20 MED ORDER — SODIUM CHLORIDE 0.9 % IV SOLN
Freq: Once | INTRAVENOUS | Status: AC
  Administered 2017-02-20: 15:00:00 via INTRAVENOUS

## 2017-02-20 MED ORDER — SODIUM CHLORIDE 0.9 % IV SOLN
450.0000 mg | Freq: Once | INTRAVENOUS | Status: AC
  Administered 2017-02-20: 450 mg via INTRAVENOUS
  Filled 2017-02-20: qty 21.43

## 2017-02-20 MED ORDER — SODIUM CHLORIDE 0.9 % IV SOLN
420.0000 mg | Freq: Once | INTRAVENOUS | Status: AC
  Administered 2017-02-20: 420 mg via INTRAVENOUS
  Filled 2017-02-20: qty 14

## 2017-02-20 MED ORDER — HEPARIN SOD (PORK) LOCK FLUSH 100 UNIT/ML IV SOLN
500.0000 [IU] | Freq: Once | INTRAVENOUS | Status: AC | PRN
  Administered 2017-02-20: 500 [IU]
  Filled 2017-02-20: qty 5

## 2017-02-20 MED ORDER — DIPHENHYDRAMINE HCL 25 MG PO CAPS
ORAL_CAPSULE | ORAL | Status: AC
Start: 2017-02-20 — End: 2017-02-20
  Filled 2017-02-20: qty 2

## 2017-02-20 MED ORDER — ACETAMINOPHEN 325 MG PO TABS
ORAL_TABLET | ORAL | Status: AC
  Filled 2017-02-20: qty 2

## 2017-02-20 NOTE — Progress Notes (Signed)
Hematology and Oncology Follow Up Visit  Christina Joseph [id number] 1965/05/29 52 y.o. 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  She has started Femara and so far has not experienced any adverse effects.  Breast exam today was unremarkable. No lymphadenopathy noted.  She has had no issue with infection. No fever, chills, n/v, cough, rash, dizziness, SOB, chest pain, palpitations, abdominal pain or changes in bowel or bladder habits.  No swelling or tenderness in her extremities. 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 No c/o pain at this time.  She has maintained a good appetite and is staying well hydrated. Her weight is stable.   ECOG Performance Status: 1 - Symptomatic but completely ambulatory  Medications:  Allergies as of 02/20/2017   No Known Allergies     Medication List        Accurate as of 02/20/17  1:22 PM. Always use your most recent med list.          HYDROcodone-acetaminophen 5-325 MG tablet Commonly known as:  NORCO/VICODIN Take 1-2 tablets by mouth every 4 (four) hours as needed for moderate pain.   letrozole 2.5 MG tablet Commonly known as:  FEMARA Take 1 tablet (2.5 mg total) by mouth daily.   valACYclovir 1000 MG tablet Commonly known as:  VALTREX Take 1,000 mg by mouth daily as needed.       Allergies: No Known Allergies  Past Medical History, Surgical history, Social history, and Family History were reviewed and updated.  Review of  Systems: All other 10 point review of systems is negative.   Physical Exam:  vitals were not taken for this visit.   Wt Readings from Last 3 Encounters:  02/20/17 166 lb (75.3 kg)  01/30/17 166 lb (75.3 kg)  01/09/17 167 lb (75.8 kg)    Ocular: Sclerae unicteric, pupils equal, round and reactive to light Ear-nose-throat: Oropharynx clear, dentition fair Lymphatic: No cervical, supraclavicular or axillary adenopathy Lungs no rales or rhonchi, good excursion bilaterally Heart regular rate and rhythm, no murmur appreciated Abd soft, nontender, positive bowel sounds, no liver or spleen tip palpated on exam, no fluid wave  MSK no focal spinal tenderness, no joint edema Neuro: non-focal, well-oriented, appropriate affect Breasts: Right breast lumpectomy site intact. No changes on exam. No mass, lesion or rash noted  Lab Results  Component Value Date   WBC 6.3 02/20/2017   HGB 10.7 (L) 01/09/2017   HCT 33.1 (L) 02/20/2017   MCV 86.6 02/20/2017   PLT 241 02/20/2017   No results found for: FERRITIN, IRON, TIBC, UIBC, IRONPCTSAT Lab Results  Component Value Date   RBC 3.82 02/20/2017   No results found for: KPAFRELGTCHN, LAMBDASER, KAPLAMBRATIO No results found for: IGGSERUM, IGA, IGMSERUM No results found for: TOTALPROTELP, ALBUMINELP, A1GS, A2GS, BETS, BETA2SER, GAMS, MSPIKE, SPEI   Chemistry      Component Value Date/Time   NA 146 (H) 01/30/2017 0848   NA 144 01/09/2017 1914  K 3.9 01/30/2017 0848   K 3.4 01/09/2017 0927   CL 105 01/30/2017 0848   CL 105 01/09/2017 0927   CO2 28 01/30/2017 0848   CO2 29 01/09/2017 0927   BUN 12 01/30/2017 0848   BUN 7 01/09/2017 0927   CREATININE 0.9 01/09/2017 0927      Component Value Date/Time   CALCIUM 9.4 01/30/2017 0848   CALCIUM 9.1 01/09/2017 0927   ALKPHOS 61 01/30/2017 0848   ALKPHOS 68 01/09/2017 0927   AST 21 01/30/2017 0848   ALT 27 01/30/2017 0848   ALT 26 01/09/2017 0927   BILITOT 0.7 01/30/2017 0848       Impression and Plan: [name] is a very pleasant 52 yo caucasian female with stage IIIA invasive ductal carcinoma of the right breast with lumpectomy.  She has done well so far with Herceptin and Perjeta. We will proceed with treatment today as planned.  She is tolerating Femara nicely.  She will have a repeat ECHO on 03/08/2017.  We will plan to see her back in another 3 weeks for repeat lab work and follow-up.  She will contact our office with any questions or concerns. We can certainly see her sooner if need be.   [name], NP 1/30/20191:22 PM

## 2017-02-20 NOTE — Patient Instructions (Signed)
[facility] Discharge Instructions for Patients Receiving Chemotherapy  Today you received the following chemotherapy agents Herceptin, Perjeta.  To help prevent nausea and vomiting after your treatment, we encourage you to take your nausea medication as indicated by your MD.   If you develop nausea and vomiting that is not controlled by your nausea medication, call the clinic.   BELOW ARE SYMPTOMS THAT SHOULD BE REPORTED IMMEDIATELY:  *FEVER GREATER THAN 100.5 F  *CHILLS WITH OR WITHOUT FEVER  NAUSEA AND VOMITING THAT IS NOT CONTROLLED WITH YOUR NAUSEA MEDICATION  *UNUSUAL SHORTNESS OF BREATH  *UNUSUAL BRUISING OR BLEEDING  TENDERNESS IN MOUTH AND THROAT WITH OR WITHOUT PRESENCE OF ULCERS  *URINARY PROBLEMS  *BOWEL PROBLEMS  UNUSUAL RASH Items with * indicate a potential emergency and should be followed up as soon as possible.  Feel free to call the clinic should you have any questions or concerns. The clinic phone number is [phone].  Please show the CHEMO ALERT CARD at check-in to the Emergency Department and triage nurse.   

## 2017-02-20 NOTE — Patient Instructions (Signed)

## 2017-02-21 ENCOUNTER — Telehealth: Payer: Self-pay | Admitting: *Deleted

## 2017-02-21 LAB — VITAMIN D 25 HYDROXY (VIT D DEFICIENCY, FRACTURES): VIT D 25 HYDROXY: 26.9 ng/mL — AB (ref 30.0–100.0)

## 2017-02-21 MED ORDER — ERGOCALCIFEROL 1.25 MG (50000 UT) PO CAPS: 50000.0000 [IU] | ORAL_CAPSULE | ORAL | 6 refills | Status: DC

## 2017-02-21 NOTE — Telephone Encounter (Signed)
Prescription called in to patient pharmacy

## 2017-02-21 NOTE — Telephone Encounter (Signed)
-----   Message from [name], MD sent at 02/21/2017  5:59 AM EST ----- Call - Vit D level is low!!!   If she is not on vit D, have her take 2000 units daily.  If she is on vit D, have her take 50000 units weekly.  Thanks!  pete

## 2017-03-08 ENCOUNTER — Ambulatory Visit (HOSPITAL_BASED_OUTPATIENT_CLINIC_OR_DEPARTMENT_OTHER)
Admission: RE | Admit: 2017-03-08 | Discharge: 2017-03-08 | Disposition: A | Discharge status: Home / Self Care | Payer: BC Managed Care – PPO | Source: Ambulatory Visit | Attending: Family | Admitting: Family

## 2017-03-08 DIAGNOSIS — I272 Pulmonary hypertension, unspecified: Secondary | ICD-10-CM | POA: Insufficient documentation

## 2017-03-08 DIAGNOSIS — I340 Nonrheumatic mitral (valve) insufficiency: Secondary | ICD-10-CM | POA: Insufficient documentation

## 2017-03-08 DIAGNOSIS — Z170 Estrogen receptor positive status [ER+]: Secondary | ICD-10-CM | POA: Diagnosis not present

## 2017-03-08 DIAGNOSIS — C50911 Malignant neoplasm of unspecified site of right female breast: Secondary | ICD-10-CM | POA: Insufficient documentation

## 2017-03-08 LAB — ECHOCARDIOGRAM COMPLETE
AO mean calculated velocity dopler: 117 cm/s
AOPV: 0.54 m/s
AV Area VTI index: 1.06 cm2/m2
AV Area VTI: 2.04 cm2
AV Area mean vel: 1.97 cm2
AV Peak grad: 10 mmHg
AV area mean vel ind: 1.09 cm2/m2
AV peak Index: 1.13
AV pk vel: 160 cm/s
AVA: 1.92 cm2
AVCELMEANRAT: 0.52
AVG: 6 mmHg
Ao-asc: 29 cm
CHL CUP AV VEL: 1.92
CHL CUP DOP CALC LVOT VTI: 17.9 cm
CHL CUP MV DEC (S): 185
CHL CUP RV SYS PRESS: 47 mmHg
E decel time: 185 msec
E/e' ratio: 16.3
FS: 22 % — AB (ref 28–44)
IVS/LV PW RATIO, ED: 0.95
LA ID, A-P, ES: 38 mm
LA diam end sys: 38 mm
LA vol: 51.7 mL
LADIAMINDEX: 2.1 cm/m2
LAVOLA4C: 47.9 mL
LAVOLIN: 28.6 mL/m2
LDCA: 3.8 cm2
LV E/e' medial: 16.3
LV E/e'average: 16.3
LV PW d: 10.6 mm — AB (ref 0.6–1.1)
LV SIMPSON'S DISK: 45
LV TDI E'LATERAL: 8.59
LV dias vol index: 60 mL/m2
LV dias vol: 108 mL — AB (ref 46–106)
LV sys vol index: 33 mL/m2
LVELAT: 8.59 cm/s
LVOT peak vel: 85.8 cm/s
LVOTD: 22 mm
LVOTSV: 68 mL
LVOTVTI: 0.51 cm
LVSYSVOL: 59 mL — AB
Lateral S' vel: 12.7 cm/s
MV pk A vel: 93.6 m/s
MVPG: 8 mmHg
MVPKEVEL: 140 m/s
RV TAPSE: 24.8 mm
Reg peak vel: 330 cm/s
Stroke v: 49 ml
TDI e' medial: 8.59
TR max vel: 330 cm/s
VTI: 35.4 cm
Valve area index: 1.06

## 2017-03-08 NOTE — Progress Notes (Signed)
Echocardiogram 2D Echocardiogram has been performed.  Joelene Millin 03/08/2017, 3:59 PM

## 2017-03-13 ENCOUNTER — Inpatient Hospital Stay: Payer: BC Managed Care – PPO

## 2017-03-13 ENCOUNTER — Encounter: Payer: Self-pay | Admitting: Hematology & Oncology

## 2017-03-13 ENCOUNTER — Other Ambulatory Visit: Payer: Self-pay

## 2017-03-13 ENCOUNTER — Inpatient Hospital Stay: Payer: BC Managed Care – PPO | Attending: Hematology & Oncology | Admitting: Hematology & Oncology

## 2017-03-13 VITALS — BP 133/80 | HR 86 | Temp 98.2°F | Resp 18 | Wt 167.0 lb

## 2017-03-13 DIAGNOSIS — Z5112 Encounter for antineoplastic immunotherapy: Secondary | ICD-10-CM | POA: Diagnosis not present

## 2017-03-13 DIAGNOSIS — Z170 Estrogen receptor positive status [ER+]: Secondary | ICD-10-CM | POA: Diagnosis not present

## 2017-03-13 DIAGNOSIS — C773 Secondary and unspecified malignant neoplasm of axilla and upper limb lymph nodes: Secondary | ICD-10-CM | POA: Diagnosis present

## 2017-03-13 DIAGNOSIS — E559 Vitamin D deficiency, unspecified: Secondary | ICD-10-CM

## 2017-03-13 DIAGNOSIS — Z0000 Encounter for general adult medical examination without abnormal findings: Secondary | ICD-10-CM

## 2017-03-13 DIAGNOSIS — C50911 Malignant neoplasm of unspecified site of right female breast: Secondary | ICD-10-CM

## 2017-03-13 LAB — CBC WITH DIFFERENTIAL (CANCER CENTER ONLY)
BASOS PCT: 0 %
Basophils Absolute: 0 10*3/uL (ref 0.0–0.1)
EOS ABS: 0.1 10*3/uL (ref 0.0–0.5)
Eosinophils Relative: 1 %
HEMATOCRIT: 33.6 % — AB (ref 34.8–46.6)
Hemoglobin: 10.9 g/dL — ABNORMAL LOW (ref 11.6–15.9)
LYMPHS ABS: 1.3 10*3/uL (ref 0.9–3.3)
Lymphocytes Relative: 25 %
MCH: 28.1 pg (ref 26.0–34.0)
MCHC: 32.4 g/dL (ref 32.0–36.0)
MCV: 86.6 fL (ref 81.0–101.0)
MONOS PCT: 6 %
Monocytes Absolute: 0.3 10*3/uL (ref 0.1–0.9)
NEUTROS ABS: 3.5 10*3/uL (ref 1.5–6.5)
NEUTROS PCT: 68 %
Platelet Count: 231 10*3/uL (ref 145–400)
RBC: 3.88 MIL/uL (ref 3.70–5.32)
RDW: 14.8 % (ref 11.1–15.7)
WBC: 5.1 10*3/uL (ref 3.9–10.0)

## 2017-03-13 LAB — CMP (CANCER CENTER ONLY)
ALT: 23 U/L (ref 10–47)
AST: 29 U/L (ref 11–38)
Albumin: 3.9 g/dL (ref 3.5–5.0)
Alkaline Phosphatase: 66 U/L (ref 26–84)
Anion gap: 7 (ref 5–15)
BUN: 16 mg/dL (ref 7–22)
CALCIUM: 9.2 mg/dL (ref 8.0–10.3)
CO2: 27 mmol/L (ref 18–33)
Chloride: 109 mmol/L — ABNORMAL HIGH (ref 98–108)
Creatinine: 0.7 mg/dL (ref 0.60–1.20)
GLUCOSE: 97 mg/dL (ref 73–118)
POTASSIUM: 3.7 mmol/L (ref 3.3–4.7)
SODIUM: 143 mmol/L (ref 128–145)
TOTAL PROTEIN: 7.4 g/dL (ref 6.4–8.1)
Total Bilirubin: 0.5 mg/dL (ref 0.2–1.6)

## 2017-03-13 MED ORDER — SODIUM CHLORIDE 0.9 % IV SOLN
420.0000 mg | Freq: Once | INTRAVENOUS | Status: AC
  Administered 2017-03-13: 420 mg via INTRAVENOUS
  Filled 2017-03-13: qty 14

## 2017-03-13 MED ORDER — TRASTUZUMAB CHEMO 150 MG IV SOLR
450.0000 mg | Freq: Once | INTRAVENOUS | Status: AC
  Administered 2017-03-13: 450 mg via INTRAVENOUS
  Filled 2017-03-13: qty 21.43

## 2017-03-13 MED ORDER — DIPHENHYDRAMINE HCL 25 MG PO CAPS
ORAL_CAPSULE | ORAL | Status: AC
  Filled 2017-03-13: qty 2

## 2017-03-13 MED ORDER — ACETAMINOPHEN 325 MG PO TABS
650.0000 mg | ORAL_TABLET | Freq: Once | ORAL | Status: AC
  Administered 2017-03-13: 650 mg via ORAL

## 2017-03-13 MED ORDER — SODIUM CHLORIDE 0.9 % IV SOLN
Freq: Once | INTRAVENOUS | Status: AC
  Administered 2017-03-13: 15:00:00 via INTRAVENOUS

## 2017-03-13 MED ORDER — SODIUM CHLORIDE 0.9% FLUSH
10.0000 mL | INTRAVENOUS | Status: DC | PRN
  Administered 2017-03-13: 10 mL via INTRAVENOUS
  Filled 2017-03-13: qty 10

## 2017-03-13 MED ORDER — HEPARIN SOD (PORK) LOCK FLUSH 100 UNIT/ML IV SOLN
500.0000 [IU] | Freq: Once | INTRAVENOUS | Status: AC | PRN
  Administered 2017-03-13: 500 [IU]
  Filled 2017-03-13: qty 5

## 2017-03-13 MED ORDER — SODIUM CHLORIDE 0.9% FLUSH
10.0000 mL | INTRAVENOUS | Status: DC | PRN
  Administered 2017-03-13: 10 mL
  Filled 2017-03-13: qty 10

## 2017-03-13 MED ORDER — ACETAMINOPHEN 325 MG PO TABS
ORAL_TABLET | ORAL | Status: AC
  Filled 2017-03-13: qty 2

## 2017-03-13 MED ORDER — DIPHENHYDRAMINE HCL 25 MG PO CAPS
50.0000 mg | ORAL_CAPSULE | Freq: Once | ORAL | Status: AC
  Administered 2017-03-13: 50 mg via ORAL

## 2017-03-13 NOTE — Patient Instructions (Signed)
Pertuzumab injection What is this medicine? PERTUZUMAB (per TOOZ ue mab) is a monoclonal antibody. It is used to treat breast cancer. This medicine may be used for other purposes; ask your health care provider or pharmacist if you have questions. COMMON BRAND NAME(S): PERJETA What should I tell my health care provider before I take this medicine? They need to know if you have any of these conditions: -heart disease -heart failure -high blood pressure -history of irregular heart beat -recent or ongoing radiation therapy -an unusual or allergic reaction to pertuzumab, other medicines, foods, dyes, or preservatives -pregnant or trying to get pregnant -breast-feeding How should I use this medicine? This medicine is for infusion into a vein. It is given by a health care professional in a hospital or clinic setting. Talk to your pediatrician regarding the use of this medicine in children. Special care may be needed. Overdosage: If you think you have taken too much of this medicine contact a poison control center or emergency room at once. NOTE: This medicine is only for you. Do not share this medicine with others. What if I miss a dose? It is important not to miss your dose. Call your doctor or health care professional if you are unable to keep an appointment. What may interact with this medicine? Interactions are not expected. Give your health care provider a list of all the medicines, herbs, non-prescription drugs, or dietary supplements you use. Also tell them if you smoke, drink alcohol, or use illegal drugs. Some items may interact with your medicine. This list may not describe all possible interactions. Give your health care provider a list of all the medicines, herbs, non-prescription drugs, or dietary supplements you use. Also tell them if you smoke, drink alcohol, or use illegal drugs. Some items may interact with your medicine. What should I watch for while using this medicine? Your  condition will be monitored carefully while you are receiving this medicine. Report any side effects. Continue your course of treatment even though you feel ill unless your doctor tells you to stop. Do not become pregnant while taking this medicine or for 7 months after stopping it. Women should inform their doctor if they wish to become pregnant or think they might be pregnant. Women of child-bearing potential will need to have a negative pregnancy test before starting this medicine. There is a potential for serious side effects to an unborn child. Talk to your health care professional or pharmacist for more information. Do not breast-feed an infant while taking this medicine or for 7 months after stopping it. Women must use effective birth control with this medicine. Call your doctor or health care professional for advice if you get a fever, chills or sore throat, or other symptoms of a cold or flu. Do not treat yourself. Try to avoid being around people who are sick. You may experience fever, chills, and headache during the infusion. Report any side effects during the infusion to your health care professional. What side effects may I notice from receiving this medicine? Side effects that you should report to your doctor or health care professional as soon as possible: -breathing problems -chest pain or palpitations -dizziness -feeling faint or lightheaded -fever or chills -skin rash, itching or hives -sore throat -swelling of the face, lips, or tongue -swelling of the legs or ankles -unusually weak or tired Side effects that usually do not require medical attention (report to your doctor or health care professional if they continue or are bothersome): -diarrhea -hair   loss -nausea, vomiting -tiredness This list may not describe all possible side effects. Call your doctor for medical advice about side effects. You may report side effects to FDA at 1-800-FDA-1088. Where should I keep my  medicine? This drug is given in a hospital or clinic and will not be stored at home. NOTE: This sheet is a summary. It may not cover all possible information. If you have questions about this medicine, talk to your doctor, pharmacist, or health care provider.  2018 Elsevier/Gold Standard (2015-02-10 12:08:50) Trastuzumab injection for infusion What is this medicine? TRASTUZUMAB (tras TOO zoo mab) is a monoclonal antibody. It is used to treat breast cancer and stomach cancer. This medicine may be used for other purposes; ask your health care provider or pharmacist if you have questions. COMMON BRAND NAME(S): Herceptin What should I tell my health care provider before I take this medicine? They need to know if you have any of these conditions: -heart disease -heart failure -lung or breathing disease, like asthma -an unusual or allergic reaction to trastuzumab, benzyl alcohol, or other medications, foods, dyes, or preservatives -pregnant or trying to get pregnant -breast-feeding How should I use this medicine? This drug is given as an infusion into a vein. It is administered in a hospital or clinic by a specially trained health care professional. Talk to your pediatrician regarding the use of this medicine in children. This medicine is not approved for use in children. Overdosage: If you think you have taken too much of this medicine contact a poison control center or emergency room at once. NOTE: This medicine is only for you. Do not share this medicine with others. What if I miss a dose? It is important not to miss a dose. Call your doctor or health care professional if you are unable to keep an appointment. What may interact with this medicine? This medicine may interact with the following medications: -certain types of chemotherapy, such as daunorubicin, doxorubicin, epirubicin, and idarubicin This list may not describe all possible interactions. Give your health care provider a list of  all the medicines, herbs, non-prescription drugs, or dietary supplements you use. Also tell them if you smoke, drink alcohol, or use illegal drugs. Some items may interact with your medicine. What should I watch for while using this medicine? Visit your doctor for checks on your progress. Report any side effects. Continue your course of treatment even though you feel ill unless your doctor tells you to stop. Call your doctor or health care professional for advice if you get a fever, chills or sore throat, or other symptoms of a cold or flu. Do not treat yourself. Try to avoid being around people who are sick. You may experience fever, chills and shaking during your first infusion. These effects are usually mild and can be treated with other medicines. Report any side effects during the infusion to your health care professional. Fever and chills usually do not happen with later infusions. Do not become pregnant while taking this medicine or for 7 months after stopping it. Women should inform their doctor if they wish to become pregnant or think they might be pregnant. Women of child-bearing potential will need to have a negative pregnancy test before starting this medicine. There is a potential for serious side effects to an unborn child. Talk to your health care professional or pharmacist for more information. Do not breast-feed an infant while taking this medicine or for 7 months after stopping it. Women must use effective birth control   with this medicine. What side effects may I notice from receiving this medicine? Side effects that you should report to your doctor or health care professional as soon as possible: -allergic reactions like skin rash, itching or hives, swelling of the face, lips, or tongue -chest pain or palpitations -cough -dizziness -feeling faint or lightheaded, falls -fever -general ill feeling or flu-like symptoms -signs of worsening heart failure like breathing problems;  swelling in your legs and feet -unusually weak or tired Side effects that usually do not require medical attention (report to your doctor or health care professional if they continue or are bothersome): -bone pain -changes in taste -diarrhea -joint pain -nausea/vomiting -weight loss This list may not describe all possible side effects. Call your doctor for medical advice about side effects. You may report side effects to FDA at 1-800-FDA-1088. Where should I keep my medicine? This drug is given in a hospital or clinic and will not be stored at home. NOTE: This sheet is a summary. It may not cover all possible information. If you have questions about this medicine, talk to your doctor, pharmacist, or health care provider.  2018 Elsevier/Gold Standard (2016-01-03 14:37:52)  

## 2017-03-13 NOTE — Progress Notes (Signed)
Hematology and Oncology Follow Up Visit  Christina Joseph [id number] 12-01-1965 52 y.o. 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  Despite the bad weather, she still wanted to work at the regular time.  Her school was delayed 2 hours.  Her hair is coming back so nicely.  I am just very impressed with how well she looks.  She is doing well with the Femara.  She is had no problems with nausea or vomiting.  She is had no cough or shortness of breath.  She is taking vitamin D supplements.  This is helping with myalgias and arthralgias.  She had a echocardiogram done last week.  This showed an ejection fraction of 50-55%.  There was no focal wall motion abnormalities.  She has had no change in bowel or bladder habits.  She had a nice Valentine's Day last week.  ECOG Performance Status: 1 - Symptomatic but completely ambulatory  Medications:  Allergies as of 03/13/2017   No Known Allergies     Medication List        Accurate as of 03/13/17  2:51 PM. Always use your most recent med list.          ergocalciferol 50000 units capsule Commonly known as:  VITAMIN D2 Take 1 capsule (50,000 Units total) by mouth once a week.   HYDROcodone-acetaminophen 5-325 MG tablet Commonly known as:  NORCO/VICODIN Take 1-2 tablets by mouth every 4 (four) hours as needed for moderate pain.   letrozole 2.5 MG tablet Commonly known as:  FEMARA Take 1 tablet (2.5 mg total) by mouth daily.   valACYclovir 1000 MG tablet Commonly known as:  VALTREX Take 1,000 mg by mouth  daily as needed.       Allergies: No Known Allergies  Past Medical History, Surgical history, Social history, and Family History were reviewed and updated.  Review of Systems: Review of Systems  Constitutional: Negative.   HENT: Negative.   Eyes: Negative.   Respiratory: Negative.   Cardiovascular: Negative.   Gastrointestinal: Negative.   Genitourinary: Negative.   Musculoskeletal: Negative.   Skin: Negative.   Neurological: Negative.   Endo/Heme/Allergies: Negative.   Psychiatric/Behavioral: Negative.      Physical Exam:  weight is 167 lb (75.8 kg). Her oral temperature is 98.2 F (36.8 C). Her blood pressure is 133/80 and her pulse is 86. Her respiration is 18 and oxygen saturation is 100%.   Wt Readings from Last 3 Encounters:  03/13/17 167 lb (75.8 kg)  02/20/17 166 lb (75.3 kg)  01/30/17 166 lb (75.3 kg)    Physical Exam  Constitutional: She is oriented to person, place, and time.  HENT:  Head: Normocephalic and atraumatic.  Mouth/Throat: Oropharynx is clear and moist.  Eyes: EOM are normal. Pupils are equal, round, and reactive to light.  Neck: Normal range of motion.  Cardiovascular: Normal rate, regular rhythm and normal heart sounds.  Pulmonary/Chest: Effort normal and breath sounds normal.  Abdominal: Soft. Bowel sounds are normal.  Musculoskeletal: Normal range of motion. She exhibits no edema,  tenderness or deformity.  Lymphadenopathy:    She has no cervical adenopathy.  Neurological: She is alert and oriented to person, place, and time.  Skin: Skin is warm and dry. No rash noted. No erythema.  Psychiatric: She has a normal mood and affect. Her behavior is normal. Judgment and thought content normal.  Vitals reviewed.   Lab Results  Component Value Date   WBC 5.1 03/13/2017   HGB 10.7 (L) 01/09/2017   HCT 33.6 (L) 03/13/2017   MCV 86.6 03/13/2017   PLT 231 03/13/2017   No results found for: FERRITIN, IRON, TIBC, UIBC, IRONPCTSAT Lab Results   Component Value Date   RBC 3.88 03/13/2017   No results found for: KPAFRELGTCHN, LAMBDASER, KAPLAMBRATIO No results found for: IGGSERUM, IGA, IGMSERUM No results found for: Odetta Pink, SPEI   Chemistry      Component Value Date/Time   NA 143 03/13/2017 1325   NA 144 01/09/2017 0927   K 3.7 03/13/2017 1325   K 3.4 01/09/2017 0927   CL 109 (H) 03/13/2017 1325   CL 105 01/09/2017 0927   CO2 27 03/13/2017 1325   CO2 29 01/09/2017 0927   BUN 16 03/13/2017 1325   BUN 7 01/09/2017 0927   CREATININE 0.70 03/13/2017 1325   CREATININE 0.9 01/09/2017 0927      Component Value Date/Time   CALCIUM 9.2 03/13/2017 1325   CALCIUM 9.1 01/09/2017 0927   ALKPHOS 66 03/13/2017 1325   ALKPHOS 68 01/09/2017 0927   AST 29 03/13/2017 1325   ALT 23 03/13/2017 1325   ALT 26 01/09/2017 0927   BILITOT 0.5 03/13/2017 1325      Impression and Plan: Christina Joseph is a very pleasant 52 yo postmenopausal Caucasian female with stage IIIA invasive ductal carcinoma of the right breast with lumpectomy.   She has done well so far with Herceptin and Perjeta.   We will proceed with treatment today as planned.   She is tolerating Femara nicely.   We will plan to get her back in another 3 weeks.  She will be a candidate for Nerlynx once we complete the Herceptin/Perjeta protocol.  [name], MD 2/20/20192:51 PM

## 2017-03-13 NOTE — Patient Instructions (Signed)
Implanted Port Home Guide An implanted port is a type of central line that is placed under the skin. Central lines are used to provide IV access when treatment or nutrition needs to be given through a person's veins. Implanted ports are used for long-term IV access. An implanted port may be placed because:  You need IV medicine that would be irritating to the small veins in your hands or arms.  You need long-term IV medicines, such as antibiotics.  You need IV nutrition for a long period.  You need frequent blood draws for lab tests.  You need dialysis.  Implanted ports are usually placed in the chest area, but they can also be placed in the upper arm, the abdomen, or the leg. An implanted port has two main parts:  Reservoir. The reservoir is round and will appear as a small, raised area under your skin. The reservoir is the part where a needle is inserted to give medicines or draw blood.  Catheter. The catheter is a thin, flexible tube that extends from the reservoir. The catheter is placed into a large vein. Medicine that is inserted into the reservoir goes into the catheter and then into the vein.  How will I care for my incision site? Do not get the incision site wet. Bathe or shower as directed by your health care provider. How is my port accessed? Special steps must be taken to access the port:  Before the port is accessed, a numbing cream can be placed on the skin. This helps numb the skin over the port site.  Your health care provider uses a sterile technique to access the port. ? Your health care provider must put on a mask and sterile gloves. ? The skin over your port is cleaned carefully with an antiseptic and allowed to dry. ? The port is gently pinched between sterile gloves, and a needle is inserted into the port.  Only "non-coring" port needles should be used to access the port. Once the port is accessed, a blood return should be checked. This helps ensure that the port  is in the vein and is not clogged.  If your port needs to remain accessed for a constant infusion, a clear (transparent) bandage will be placed over the needle site. The bandage and needle will need to be changed every week, or as directed by your health care provider.  Keep the bandage covering the needle clean and dry. Do not get it wet. Follow your health care provider's instructions on how to take a shower or bath while the port is accessed.  If your port does not need to stay accessed, no bandage is needed over the port.  What is flushing? Flushing helps keep the port from getting clogged. Follow your health care provider's instructions on how and when to flush the port. Ports are usually flushed with saline solution or a medicine called heparin. The need for flushing will depend on how the port is used.  If the port is used for intermittent medicines or blood draws, the port will need to be flushed: ? After medicines have been given. ? After blood has been drawn. ? As part of routine maintenance.  If a constant infusion is running, the port may not need to be flushed.  How long will my port stay implanted? The port can stay in for as long as your health care provider thinks it is needed. When it is time for the port to come out, surgery will be   done to remove it. The procedure is similar to the one performed when the port was put in. When should I seek immediate medical care? When you have an implanted port, you should seek immediate medical care if:  You notice a bad smell coming from the incision site.  You have swelling, redness, or drainage at the incision site.  You have more swelling or pain at the port site or the surrounding area.  You have a fever that is not controlled with medicine.  This information is not intended to replace advice given to you by your health care provider. Make sure you discuss any questions you have with your health care provider. Document  Released: 01/08/2005 Document Revised: 06/16/2015 Document Reviewed: 09/15/2012 Elsevier Interactive Patient Education  2017 [employer]  

## 2017-03-14 LAB — VITAMIN D 25 HYDROXY (VIT D DEFICIENCY, FRACTURES): Vit D, 25-Hydroxy: 32.9 ng/mL (ref 30.0–100.0)

## 2017-04-02 ENCOUNTER — Encounter: Payer: Self-pay | Admitting: Oncology

## 2017-04-03 ENCOUNTER — Other Ambulatory Visit: Payer: Self-pay

## 2017-04-03 ENCOUNTER — Encounter: Payer: Self-pay | Admitting: Radiation Oncology

## 2017-04-03 ENCOUNTER — Inpatient Hospital Stay: Payer: BC Managed Care – PPO | Attending: Hematology & Oncology | Admitting: Hematology & Oncology

## 2017-04-03 ENCOUNTER — Encounter: Payer: Self-pay | Admitting: Hematology & Oncology

## 2017-04-03 ENCOUNTER — Inpatient Hospital Stay: Payer: BC Managed Care – PPO

## 2017-04-03 ENCOUNTER — Ambulatory Visit
Admission: RE | Admit: 2017-04-03 | Discharge: 2017-04-03 | Disposition: A | Discharge status: Home / Self Care | Payer: BC Managed Care – PPO | Source: Ambulatory Visit | Attending: Radiation Oncology | Admitting: Radiation Oncology

## 2017-04-03 VITALS — BP 144/75 | HR 79 | Temp 98.5°F | Ht 64.0 in | Wt 170.0 lb

## 2017-04-03 VITALS — BP 147/73 | HR 90 | Temp 98.6°F | Resp 16 | Wt 170.0 lb

## 2017-04-03 DIAGNOSIS — Z1501 Genetic susceptibility to malignant neoplasm of breast: Secondary | ICD-10-CM | POA: Insufficient documentation

## 2017-04-03 DIAGNOSIS — C50911 Malignant neoplasm of unspecified site of right female breast: Secondary | ICD-10-CM | POA: Insufficient documentation

## 2017-04-03 DIAGNOSIS — R931 Abnormal findings on diagnostic imaging of heart and coronary circulation: Secondary | ICD-10-CM

## 2017-04-03 DIAGNOSIS — Z170 Estrogen receptor positive status [ER+]: Principal | ICD-10-CM

## 2017-04-03 DIAGNOSIS — C773 Secondary and unspecified malignant neoplasm of axilla and upper limb lymph nodes: Secondary | ICD-10-CM | POA: Insufficient documentation

## 2017-04-03 DIAGNOSIS — Z923 Personal history of irradiation: Secondary | ICD-10-CM

## 2017-04-03 DIAGNOSIS — C50211 Malignant neoplasm of upper-inner quadrant of right female breast: Secondary | ICD-10-CM

## 2017-04-03 DIAGNOSIS — Z5112 Encounter for antineoplastic immunotherapy: Secondary | ICD-10-CM | POA: Diagnosis not present

## 2017-04-03 DIAGNOSIS — Z79899 Other long term (current) drug therapy: Secondary | ICD-10-CM | POA: Insufficient documentation

## 2017-04-03 DIAGNOSIS — Z0000 Encounter for general adult medical examination without abnormal findings: Secondary | ICD-10-CM

## 2017-04-03 LAB — CBC WITH DIFFERENTIAL (CANCER CENTER ONLY)
BASOS ABS: 0 10*3/uL (ref 0.0–0.1)
BASOS PCT: 0 %
Eosinophils Absolute: 0.1 10*3/uL (ref 0.0–0.5)
Eosinophils Relative: 2 %
HEMATOCRIT: 33.8 % — AB (ref 34.8–46.6)
Hemoglobin: 11.1 g/dL — ABNORMAL LOW (ref 11.6–15.9)
LYMPHS PCT: 23 %
Lymphs Abs: 1.3 10*3/uL (ref 0.9–3.3)
MCH: 28.4 pg (ref 26.0–34.0)
MCHC: 32.8 g/dL (ref 32.0–36.0)
MCV: 86.4 fL (ref 81.0–101.0)
MONO ABS: 0.4 10*3/uL (ref 0.1–0.9)
Monocytes Relative: 7 %
NEUTROS ABS: 3.6 10*3/uL (ref 1.5–6.5)
Neutrophils Relative %: 68 %
PLATELETS: 221 10*3/uL (ref 145–400)
RBC: 3.91 MIL/uL (ref 3.70–5.32)
RDW: 14.7 % (ref 11.1–15.7)
WBC: 5.4 10*3/uL (ref 3.9–10.0)

## 2017-04-03 LAB — CMP (CANCER CENTER ONLY)
ALK PHOS: 61 U/L (ref 26–84)
ALT: 23 U/L (ref 10–47)
ANION GAP: 6 (ref 5–15)
AST: 29 U/L (ref 11–38)
Albumin: 3.8 g/dL (ref 3.5–5.0)
BILIRUBIN TOTAL: 0.5 mg/dL (ref 0.2–1.6)
BUN: 16 mg/dL (ref 7–22)
CALCIUM: 8.7 mg/dL (ref 8.0–10.3)
CO2: 28 mmol/L (ref 18–33)
Chloride: 109 mmol/L — ABNORMAL HIGH (ref 98–108)
Creatinine: 0.7 mg/dL (ref 0.60–1.20)
GLUCOSE: 116 mg/dL (ref 73–118)
Potassium: 3.8 mmol/L (ref 3.3–4.7)
Sodium: 143 mmol/L (ref 128–145)
TOTAL PROTEIN: 7.3 g/dL (ref 6.4–8.1)

## 2017-04-03 LAB — LACTATE DEHYDROGENASE: LDH: 195 U/L (ref 125–245)

## 2017-04-03 MED ORDER — SODIUM CHLORIDE 0.9 % IV SOLN
420.0000 mg | Freq: Once | INTRAVENOUS | Status: AC
  Administered 2017-04-03: 420 mg via INTRAVENOUS
  Filled 2017-04-03: qty 14

## 2017-04-03 MED ORDER — DIPHENHYDRAMINE HCL 25 MG PO CAPS
50.0000 mg | ORAL_CAPSULE | Freq: Once | ORAL | Status: AC
  Administered 2017-04-03: 50 mg via ORAL

## 2017-04-03 MED ORDER — SODIUM CHLORIDE 0.9 % IV SOLN: Freq: Once | INTRAVENOUS | Status: DC

## 2017-04-03 MED ORDER — HEPARIN SOD (PORK) LOCK FLUSH 100 UNIT/ML IV SOLN
500.0000 [IU] | Freq: Once | INTRAVENOUS | Status: AC | PRN
  Administered 2017-04-03: 500 [IU]
  Filled 2017-04-03: qty 5

## 2017-04-03 MED ORDER — TRASTUZUMAB CHEMO 150 MG IV SOLR
450.0000 mg | Freq: Once | INTRAVENOUS | Status: AC
  Administered 2017-04-03: 450 mg via INTRAVENOUS
  Filled 2017-04-03: qty 21.43

## 2017-04-03 MED ORDER — DIPHENHYDRAMINE HCL 25 MG PO CAPS
ORAL_CAPSULE | ORAL | Status: AC
  Filled 2017-04-03: qty 2

## 2017-04-03 MED ORDER — SODIUM CHLORIDE 0.9% FLUSH
10.0000 mL | INTRAVENOUS | Status: DC | PRN
  Administered 2017-04-03: 10 mL
  Filled 2017-04-03: qty 10

## 2017-04-03 MED ORDER — ACETAMINOPHEN 325 MG PO TABS
650.0000 mg | ORAL_TABLET | Freq: Once | ORAL | Status: AC
Start: 2017-04-03 — End: 2017-04-03
  Administered 2017-04-03: 650 mg via ORAL

## 2017-04-03 MED ORDER — ACETAMINOPHEN 325 MG PO TABS
ORAL_TABLET | ORAL | Status: AC
  Filled 2017-04-03: qty 2

## 2017-04-03 NOTE — Progress Notes (Signed)
Hematology and Oncology Follow Up Visit  Christina Joseph [id number] 16-Jun-1965 52 y.o. 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 is here today for follow-up.  She is doing well.  She came from school today.  She works in [employer].  Her hair is coming back quite nicely.  I am very impressed with how well she looks.  She is trying to exercise a little bit more.  She is trying to walk a little bit more.  She is had no problems with bowels or bladder.  She is had no rashes.  She has had no headache.  She is had no fever.  There is been no bleeding.  She had an echocardiogram done back in February.  Her ejection fraction was 50-55%.  This is down a little bit from what she was back in August.  We will have to watch out with this.  I might want to do another echocardiogram on her in April or May.  ECOG Performance Status: 1 - Symptomatic but completely ambulatory  Medications:  Allergies as of 04/03/2017   No Known Allergies     Medication List        Accurate as of 04/03/17  4:43 PM. Always use your most recent med list.          ergocalciferol 50000 units capsule Commonly known as:  VITAMIN D2 Take 1 capsule (50,000 Units total) by mouth once a week.   letrozole 2.5 MG tablet Commonly known as:  FEMARA Take 1 tablet (2.5 mg total) by mouth daily.   valACYclovir 1000 MG tablet Commonly known as:  VALTREX Take 1,000 mg by mouth daily as needed.       Allergies: No Known Allergies  Past Medical History, Surgical history, Social history, and Family History were  reviewed and updated.  Review of Systems: Review of Systems  Constitutional: Negative.   HENT: Negative.   Eyes: Negative.   Respiratory: Negative.   Cardiovascular: Negative.   Gastrointestinal: Negative.   Genitourinary: Negative.   Musculoskeletal: Negative.   Skin: Negative.   Neurological: Negative.   Endo/Heme/Allergies: Negative.   Psychiatric/Behavioral: Negative.      Physical Exam:  weight is 170 lb (77.1 kg). Her oral temperature is 98.6 F (37 C). Her blood pressure is 147/73 (abnormal) and her pulse is 90. Her respiration is 16 and oxygen saturation is 100%.   Wt Readings from Last 3 Encounters:  04/03/17 170 lb (77.1 kg)  04/03/17 170 lb (77.1 kg)  03/13/17 167 lb (75.8 kg)    Physical Exam  Constitutional: She is oriented to person, place, and time.  HENT:  Head: Normocephalic and atraumatic.  Mouth/Throat: Oropharynx is clear and moist.  Eyes: EOM are normal. Pupils are equal, round, and reactive to light.  Neck: Normal range of motion.  Cardiovascular: Normal rate, regular rhythm and normal heart sounds.  Pulmonary/Chest: Effort normal and breath sounds normal.  Abdominal: Soft. Bowel sounds are normal.  Musculoskeletal: Normal range of motion. She exhibits no edema, tenderness or deformity.  Lymphadenopathy:    She  has no cervical adenopathy.  Neurological: She is alert and oriented to person, place, and time.  Skin: Skin is warm and dry. No rash noted. No erythema.  Psychiatric: She has a normal mood and affect. Her behavior is normal. Judgment and thought content normal.  Vitals reviewed.   Lab Results  Component Value Date   WBC 5.4 04/03/2017   HGB 10.7 (L) 01/09/2017   HCT 33.8 (L) 04/03/2017   MCV 86.4 04/03/2017   PLT 221 04/03/2017   No results found for: FERRITIN, IRON, TIBC, UIBC, IRONPCTSAT Lab Results  Component Value Date   RBC 3.91 04/03/2017   No results found for: KPAFRELGTCHN, LAMBDASER, KAPLAMBRATIO No results found  for: IGGSERUM, IGA, IGMSERUM No results found for: Odetta Pink, SPEI   Chemistry      Component Value Date/Time   NA 143 04/03/2017 1410   NA 144 01/09/2017 0927   K 3.8 04/03/2017 1410   K 3.4 01/09/2017 0927   CL 109 (H) 04/03/2017 1410   CL 105 01/09/2017 0927   CO2 28 04/03/2017 1410   CO2 29 01/09/2017 0927   BUN 16 04/03/2017 1410   BUN 7 01/09/2017 0927   CREATININE 0.70 04/03/2017 1410   CREATININE 0.9 01/09/2017 0927      Component Value Date/Time   CALCIUM 8.7 04/03/2017 1410   CALCIUM 9.1 01/09/2017 0927   ALKPHOS 61 04/03/2017 1410   ALKPHOS 68 01/09/2017 0927   AST 29 04/03/2017 1410   ALT 23 04/03/2017 1410   ALT 26 01/09/2017 0927   BILITOT 0.5 04/03/2017 1410      Impression and Plan: Christina Joseph is a very pleasant 52 yo postmenopausal Caucasian female with stage IIIA invasive ductal carcinoma of the right breast with lumpectomy.   State that her fingers can be a little bit stiff in the morning.  We will plan to get her back in 3 more weeks.  We will have to be cognizant of the fact that her LVEF is down a little bit.  [name], MD 3/13/20194:43 PM

## 2017-04-03 NOTE — Progress Notes (Signed)
Christina Joseph is here for follow up after treatment to her right breast.  She denies having pain or fatigue.  She is taking Femara.  The skin on her right breast is intact.  BP (!) 144/75 (BP Location: Left Arm, Patient Position: Sitting)   Pulse 79   Temp 98.5 F (36.9 C) (Oral)   Ht 5\' 4"  (1.626 m)   Wt 170 lb (77.1 kg)   SpO2 100%   BMI 29.18 kg/m    Wt Readings from Last 3 Encounters:  04/03/17 170 lb (77.1 kg)  03/13/17 167 lb (75.8 kg)  02/20/17 166 lb (75.3 kg)

## 2017-04-03 NOTE — Progress Notes (Signed)
Radiation Oncology         [phone] ________________________________   Name: Christina Joseph MRN: [id number]  Date: 04/03/2017  DOB: 08-14-1965  Follow-Up Visit Note  CC: Drue Flirt, MD  [name], MD   Diagnosis:   Pathologic stage IIIA(ypT1c,ypN1a) 1.9 cm invasive ductal carcinoma of the right breast, ER(95%), PR(50%), HER2(+) with ratio of 2.32 of HER2/Cep17 signals, Ki67 2% .   Interval Since Last Radiation:  3 months   Radiation treatments dates: 11/27/16 - 01/14/17  Site/ dose: Right breast / 50.4 Gy delivered in 28 fractions Right axilla / 45 Gy delivered in 25 fractions Boost / 10 Gy delivered in 5 fractions  Beams/energy:   1. Right breast 3D / 6X, 10X                               2. Right axilla 3D/ 10X                               3. Boost 3D / 15E  Narrative:  The patient returns today for routine follow-up.  She's doing well at this time. She is back working full-time. She is on Femara and is tolerating this medication well. She also continues on Herceptin and is also tolerating this medication well. She denies any pain in the breast area nipple discharge or bleeding. Patient denies any problems with swelling in her right arm or hand. She denies any new headaches or visual problems. She denies any new bony; pain she occasionally will have some stiffness in her joints upon awakening but this clears later in the morning                              ALLERGIES:  has No Known Allergies.  Meds: Current Outpatient Medications  Medication Sig Dispense Refill  . ergocalciferol (VITAMIN D2) 50000 units capsule Take 1 capsule (50,000 Units total) by mouth once a week. 4 capsule 6  . letrozole (FEMARA) 2.5 MG tablet Take 1 tablet (2.5 mg total) by mouth daily. 30 tablet 12  . valACYclovir (VALTREX) 1000 MG tablet Take 1,000 mg by mouth daily as needed.      No current facility-administered medications for this encounter.     Physical Findings: The  patient is in no acute distress. Patient is alert and oriented.  height is '5\' 4"'$  (1.626 m) and weight is 170 lb (77.1 kg). Her oral temperature is 98.5 F (36.9 C). Her blood pressure is 144/75 (abnormal) and her pulse is 79. Her oxygen saturation is 100%. .  Lungs are clear to auscultation bilaterally. Heart has regular rate and rhythm. No palpable cervical, supraclavicular, or axillary adenopathy. Abdomen soft, non-tender, normal bowel sounds. Examination of the left breast reveals no mass or nipple discharge. Examination of the right breast reveals a well-healed lumpectomy scar. Excellent cosmetic result. Mild edema noted in the breast. No dominant mass appreciated breast nipple discharge or bleeding. Axillary scar healed well no palpable lymphadenopathy  Lab Findings: Lab Results  Component Value Date   WBC 5.1 03/13/2017   HGB 10.7 (L) 01/09/2017   HCT 33.6 (L) 03/13/2017   MCV 86.6 03/13/2017   PLT 231 03/13/2017    Radiographic Findings: No results found.  Impression:  Patient has recovered well from her radiation treatments. No palpable or  visible signs of recurrence on exam today.  Plan:  When necessary follow-up in radiation oncology. The patient will continue follow-up with Dr. Marin Olp and Dr. Dalbert Batman.  ____________________________________  Blair Promise, PhD, MD  This document serves as a record of services personally performed by Gery Pray MD. It was created on his behalf by Delton Coombes, a trained medical scribe. The creation of this record is based on the scribe's personal observations and the provider's statements to them.

## 2017-04-03 NOTE — Patient Instructions (Signed)
Cancer Center Discharge Instructions for Patients Receiving Chemotherapy  Today you received the following chemotherapy agents Herceptin, Perjeta.  To help prevent nausea and vomiting after your treatment, we encourage you to take your nausea medication as indicated by your MD.   If you develop nausea and vomiting that is not controlled by your nausea medication, call the clinic.   BELOW ARE SYMPTOMS THAT SHOULD BE REPORTED IMMEDIATELY:  *FEVER GREATER THAN 100.5 F  *CHILLS WITH OR WITHOUT FEVER  NAUSEA AND VOMITING THAT IS NOT CONTROLLED WITH YOUR NAUSEA MEDICATION  *UNUSUAL SHORTNESS OF BREATH  *UNUSUAL BRUISING OR BLEEDING  TENDERNESS IN MOUTH AND THROAT WITH OR WITHOUT PRESENCE OF ULCERS  *URINARY PROBLEMS  *BOWEL PROBLEMS  UNUSUAL RASH Items with * indicate a potential emergency and should be followed up as soon as possible.  Feel free to call the clinic should you have any questions or concerns. The clinic phone number is [phone].  Please show the CHEMO ALERT CARD at check-in to the Emergency Department and triage nurse.   

## 2017-04-03 NOTE — Progress Notes (Signed)
Patient does not stay for the remaining observation

## 2017-04-03 NOTE — Patient Instructions (Signed)

## 2017-04-04 LAB — VITAMIN D 25 HYDROXY (VIT D DEFICIENCY, FRACTURES): Vit D, 25-Hydroxy: 30.2 ng/mL (ref 30.0–100.0)

## 2017-04-09 LAB — ESTRADIOL, ULTRA SENS

## 2017-04-24 ENCOUNTER — Inpatient Hospital Stay: Payer: BC Managed Care – PPO | Attending: Hematology & Oncology | Admitting: Family

## 2017-04-24 ENCOUNTER — Other Ambulatory Visit: Payer: Self-pay

## 2017-04-24 ENCOUNTER — Encounter: Payer: Self-pay | Admitting: Family

## 2017-04-24 ENCOUNTER — Inpatient Hospital Stay: Payer: BC Managed Care – PPO

## 2017-04-24 VITALS — BP 129/81 | HR 76 | Temp 98.6°F | Resp 18 | Wt 171.0 lb

## 2017-04-24 DIAGNOSIS — C50911 Malignant neoplasm of unspecified site of right female breast: Secondary | ICD-10-CM | POA: Insufficient documentation

## 2017-04-24 DIAGNOSIS — Z170 Estrogen receptor positive status [ER+]: Secondary | ICD-10-CM

## 2017-04-24 DIAGNOSIS — C773 Secondary and unspecified malignant neoplasm of axilla and upper limb lymph nodes: Secondary | ICD-10-CM | POA: Insufficient documentation

## 2017-04-24 DIAGNOSIS — E559 Vitamin D deficiency, unspecified: Secondary | ICD-10-CM

## 2017-04-24 DIAGNOSIS — Z5112 Encounter for antineoplastic immunotherapy: Secondary | ICD-10-CM | POA: Diagnosis present

## 2017-04-24 LAB — CBC WITH DIFFERENTIAL (CANCER CENTER ONLY)
Basophils Absolute: 0 10*3/uL (ref 0.0–0.1)
Basophils Relative: 0 %
Eosinophils Absolute: 0.1 10*3/uL (ref 0.0–0.5)
Eosinophils Relative: 1 %
HEMATOCRIT: 32.8 % — AB (ref 34.8–46.6)
Hemoglobin: 10.6 g/dL — ABNORMAL LOW (ref 11.6–15.9)
LYMPHS ABS: 1.4 10*3/uL (ref 0.9–3.3)
Lymphocytes Relative: 19 %
MCH: 27.8 pg (ref 26.0–34.0)
MCHC: 32.3 g/dL (ref 32.0–36.0)
MCV: 86.1 fL (ref 81.0–101.0)
MONOS PCT: 5 %
Monocytes Absolute: 0.4 10*3/uL (ref 0.1–0.9)
NEUTROS ABS: 5.5 10*3/uL (ref 1.5–6.5)
NEUTROS PCT: 75 %
Platelet Count: 232 10*3/uL (ref 145–400)
RBC: 3.81 MIL/uL (ref 3.70–5.32)
RDW: 14.6 % (ref 11.1–15.7)
WBC Count: 7.3 10*3/uL (ref 3.9–10.0)

## 2017-04-24 LAB — CMP (CANCER CENTER ONLY)
ALBUMIN: 4 g/dL (ref 3.5–5.0)
ALT: 25 U/L (ref 10–47)
ANION GAP: 7 (ref 5–15)
AST: 24 U/L (ref 11–38)
Alkaline Phosphatase: 56 U/L (ref 26–84)
BUN: 11 mg/dL (ref 7–22)
CHLORIDE: 107 mmol/L (ref 98–108)
CO2: 27 mmol/L (ref 18–33)
CREATININE: 0.5 mg/dL — AB (ref 0.60–1.20)
Calcium: 9.2 mg/dL (ref 8.0–10.3)
Glucose, Bld: 103 mg/dL (ref 73–118)
Potassium: 3.5 mmol/L (ref 3.3–4.7)
Sodium: 141 mmol/L (ref 128–145)
Total Bilirubin: 0.5 mg/dL (ref 0.2–1.6)
Total Protein: 7.3 g/dL (ref 6.4–8.1)

## 2017-04-24 MED ORDER — EPINEPHRINE PF 1 MG/ML IJ SOLN: 0.5000 mg | Freq: Once | INTRAMUSCULAR | Status: DC | PRN

## 2017-04-24 MED ORDER — SODIUM CHLORIDE 0.9 % IV SOLN
420.0000 mg | Freq: Once | INTRAVENOUS | Status: AC
  Administered 2017-04-24: 420 mg via INTRAVENOUS
  Filled 2017-04-24: qty 14

## 2017-04-24 MED ORDER — SODIUM CHLORIDE 0.9% FLUSH
10.0000 mL | INTRAVENOUS | Status: DC | PRN
  Administered 2017-04-24: 10 mL
  Filled 2017-04-24: qty 10

## 2017-04-24 MED ORDER — ALBUTEROL SULFATE (2.5 MG/3ML) 0.083% IN NEBU
2.5000 mg | INHALATION_SOLUTION | Freq: Once | RESPIRATORY_TRACT | Status: DC | PRN
  Filled 2017-04-24: qty 3

## 2017-04-24 MED ORDER — DIPHENHYDRAMINE HCL 50 MG/ML IJ SOLN: 25.0000 mg | Freq: Once | INTRAMUSCULAR | Status: DC | PRN

## 2017-04-24 MED ORDER — METHYLPREDNISOLONE SODIUM SUCC 125 MG IJ SOLR: 125.0000 mg | Freq: Once | INTRAMUSCULAR | Status: DC | PRN

## 2017-04-24 MED ORDER — SODIUM CHLORIDE 0.9 % IV SOLN: Freq: Once | INTRAVENOUS | Status: DC | PRN

## 2017-04-24 MED ORDER — SODIUM CHLORIDE 0.9 % IV SOLN
450.0000 mg | Freq: Once | INTRAVENOUS | Status: AC
  Administered 2017-04-24: 450 mg via INTRAVENOUS
  Filled 2017-04-24: qty 21.43

## 2017-04-24 MED ORDER — SODIUM CHLORIDE 0.9 % IV SOLN
Freq: Once | INTRAVENOUS | Status: AC
  Administered 2017-04-24: 14:00:00 via INTRAVENOUS

## 2017-04-24 MED ORDER — EPINEPHRINE PF 1 MG/10ML IJ SOSY
0.2500 mg | PREFILLED_SYRINGE | Freq: Once | INTRAMUSCULAR | Status: DC | PRN
  Filled 2017-04-24: qty 10

## 2017-04-24 MED ORDER — HEPARIN SOD (PORK) LOCK FLUSH 100 UNIT/ML IV SOLN
500.0000 [IU] | Freq: Once | INTRAVENOUS | Status: AC | PRN
  Administered 2017-04-24: 500 [IU]
  Filled 2017-04-24: qty 5

## 2017-04-24 MED ORDER — DIPHENHYDRAMINE HCL 50 MG/ML IJ SOLN: 50.0000 mg | Freq: Once | INTRAMUSCULAR | Status: DC | PRN

## 2017-04-24 MED ORDER — DIPHENHYDRAMINE HCL 25 MG PO CAPS
ORAL_CAPSULE | ORAL | Status: AC
  Filled 2017-04-24: qty 2

## 2017-04-24 MED ORDER — ACETAMINOPHEN 325 MG PO TABS
ORAL_TABLET | ORAL | Status: AC
  Filled 2017-04-24: qty 2

## 2017-04-24 MED ORDER — DIPHENHYDRAMINE HCL 25 MG PO CAPS
50.0000 mg | ORAL_CAPSULE | Freq: Once | ORAL | Status: AC
  Administered 2017-04-24: 50 mg via ORAL

## 2017-04-24 MED ORDER — ACETAMINOPHEN 325 MG PO TABS
650.0000 mg | ORAL_TABLET | Freq: Once | ORAL | Status: AC
  Administered 2017-04-24: 650 mg via ORAL

## 2017-04-24 MED ORDER — FAMOTIDINE IN NACL 20-0.9 MG/50ML-% IV SOLN: 20.0000 mg | Freq: Once | INTRAVENOUS | Status: DC | PRN

## 2017-04-24 NOTE — Progress Notes (Signed)
Hematology and Oncology Follow Up Visit  Christina Joseph [id number] [birth year] 52 y.o. 04/24/2017   Principle Diagnosis:  Stage IIIA (T2N2N0) - ER+/PR+/HER2+infiltrating ductal carcinoma of the right breast  Past Therapy: Status post lumpectomy of the right breast with 3/10 residual lymph nodes Radiotherapy to the right breast - s/p 16 Rx  Current Therapy:   Adjuvant Herceptin/Perjeta- s/p cycle 7 Femara 2.5 mg po q day - started 01/22/2017 Prolia 60 mg sq q 6 months - due again in July 2019   Interim History:  Christina Joseph is here today for follow-up and treatment. She is doing well and has no complaints at this time.  Breast exam today was negative. No mass, lesion or rash noted.  She has some numbness under the right arm since surgery. No c/o pain. No swelling, tenderness or tingling in her extremities.  No bleeding, no bruising or petechiae. No lymphadenopathy found on exam.  No fever, chills, n/v, cough, rash, dizziness, SOB, chest pain, palpitations, abdominal pain or changes in bowel or bladder habits.  She has maintained a good appetite and is staying well hydrated. Her weight is stable.   ECOG Performance Status: 1 - Symptomatic but completely ambulatory  Medications:  Allergies as of 04/24/2017   No Known Allergies     Medication List        Accurate as of 04/24/17  2:28 PM. Always use your most recent med list.          ergocalciferol 50000 units capsule Commonly known as:  VITAMIN D2 Take 1 capsule (50,000 Units total) by mouth once a week.   letrozole 2.5 MG tablet Commonly known as:  FEMARA Take 1 tablet (2.5 mg total) by mouth daily.   valACYclovir 1000 MG tablet Commonly known as:  VALTREX Take 1,000 mg by mouth daily as needed.       Allergies: No Known Allergies  Past Medical History, Surgical history, Social history, and Family History were reviewed and updated.  Review of Systems: All other 10 point review of systems is negative.   Physical  Exam:  weight is 171 lb (77.6 kg). Her oral temperature is 98.6 F (37 C). Her blood pressure is 129/81 and her pulse is 76. Her respiration is 18 and oxygen saturation is 98%.   Wt Readings from Last 3 Encounters:  04/24/17 171 lb (77.6 kg)  04/03/17 170 lb (77.1 kg)  04/03/17 170 lb (77.1 kg)    Ocular: Sclerae unicteric, pupils equal, round and reactive to light Ear-nose-throat: Oropharynx clear, dentition fair Lymphatic: No cervical, supraclavicular or axillary adenopathy Lungs no rales or rhonchi, good excursion bilaterally Heart regular rate and rhythm, no murmur appreciated Abd soft, nontender, positive bowel sounds, no liver or spleen tip palpated on exam, no fluid wave  MSK no focal spinal tenderness, no joint edema Neuro: non-focal, well-oriented, appropriate affect Breasts: No change, no mass, lesion or rash noted.   Lab Results  Component Value Date   WBC 7.3 04/24/2017   HGB 10.7 (L) 01/09/2017   HCT 32.8 (L) 04/24/2017   MCV 86.1 04/24/2017   PLT 232 04/24/2017   No results found for: FERRITIN, IRON, TIBC, UIBC, IRONPCTSAT Lab Results  Component Value Date   RBC 3.81 04/24/2017   No results found for: KPAFRELGTCHN, LAMBDASER, KAPLAMBRATIO No results found for: IGGSERUM, IGA, IGMSERUM No results found for: Ronnald Ramp, A1GS, A2GS, BETS, BETA2SER, GAMS, MSPIKE, SPEI   Chemistry      Component Value Date/Time   NA 141 04/24/2017  1330   NA 144 01/09/2017 0927   K 3.5 04/24/2017 1330   K 3.4 01/09/2017 0927   CL 107 04/24/2017 1330   CL 105 01/09/2017 0927   CO2 27 04/24/2017 1330   CO2 29 01/09/2017 0927   BUN 11 04/24/2017 1330   BUN 7 01/09/2017 0927   CREATININE 0.50 (L) 04/24/2017 1330   CREATININE 0.9 01/09/2017 0927      Component Value Date/Time   CALCIUM 9.2 04/24/2017 1330   CALCIUM 9.1 01/09/2017 0927   ALKPHOS 56 04/24/2017 1330   ALKPHOS 68 01/09/2017 0927   AST 24 04/24/2017 1330   ALT 25 04/24/2017 1330   ALT 26  01/09/2017 0927   BILITOT 0.5 04/24/2017 1330      Impression and Plan: Christina Joseph is a very pleasant 52 yo postmenopausal caucasian female with  Stage IIIA invasive ductal carcinoma of the right breast with lumpectomy. She continues to do well and has no complaints at this time.  We will proceed with treatment today as planned.  We will repeat an ECHO in late April/early May.  We will see her back in another 3 weeks for follow-up and treatment.  She will contact our office with any questions or concerns. We can certainly see her sooner if need be.   [name], NP 4/3/20192:28 PM

## 2017-04-24 NOTE — Patient Instructions (Signed)
Pertuzumab injection What is this medicine? PERTUZUMAB (per TOOZ ue mab) is a monoclonal antibody. It is used to treat breast cancer. This medicine may be used for other purposes; ask your health care provider or pharmacist if you have questions. COMMON BRAND NAME(S): PERJETA What should I tell my health care provider before I take this medicine? They need to know if you have any of these conditions: -heart disease -heart failure -high blood pressure -history of irregular heart beat -recent or ongoing radiation therapy -an unusual or allergic reaction to pertuzumab, other medicines, foods, dyes, or preservatives -pregnant or trying to get pregnant -breast-feeding How should I use this medicine? This medicine is for infusion into a vein. It is given by a health care professional in a hospital or clinic setting. Talk to your pediatrician regarding the use of this medicine in children. Special care may be needed. Overdosage: If you think you have taken too much of this medicine contact a poison control center or emergency room at once. NOTE: This medicine is only for you. Do not share this medicine with others. What if I miss a dose? It is important not to miss your dose. Call your doctor or health care professional if you are unable to keep an appointment. What may interact with this medicine? Interactions are not expected. Give your health care provider a list of all the medicines, herbs, non-prescription drugs, or dietary supplements you use. Also tell them if you smoke, drink alcohol, or use illegal drugs. Some items may interact with your medicine. This list may not describe all possible interactions. Give your health care provider a list of all the medicines, herbs, non-prescription drugs, or dietary supplements you use. Also tell them if you smoke, drink alcohol, or use illegal drugs. Some items may interact with your medicine. What should I watch for while using this medicine? Your  condition will be monitored carefully while you are receiving this medicine. Report any side effects. Continue your course of treatment even though you feel ill unless your doctor tells you to stop. Do not become pregnant while taking this medicine or for 7 months after stopping it. Women should inform their doctor if they wish to become pregnant or think they might be pregnant. Women of child-bearing potential will need to have a negative pregnancy test before starting this medicine. There is a potential for serious side effects to an unborn child. Talk to your health care professional or pharmacist for more information. Do not breast-feed an infant while taking this medicine or for 7 months after stopping it. Women must use effective birth control with this medicine. Call your doctor or health care professional for advice if you get a fever, chills or sore throat, or other symptoms of a cold or flu. Do not treat yourself. Try to avoid being around people who are sick. You may experience fever, chills, and headache during the infusion. Report any side effects during the infusion to your health care professional. What side effects may I notice from receiving this medicine? Side effects that you should report to your doctor or health care professional as soon as possible: -breathing problems -chest pain or palpitations -dizziness -feeling faint or lightheaded -fever or chills -skin rash, itching or hives -sore throat -swelling of the face, lips, or tongue -swelling of the legs or ankles -unusually weak or tired Side effects that usually do not require medical attention (report to your doctor or health care professional if they continue or are bothersome): -diarrhea -hair   loss -nausea, vomiting -tiredness This list may not describe all possible side effects. Call your doctor for medical advice about side effects. You may report side effects to FDA at 1-800-FDA-1088. Where should I keep my  medicine? This drug is given in a hospital or clinic and will not be stored at home. NOTE: This sheet is a summary. It may not cover all possible information. If you have questions about this medicine, talk to your doctor, pharmacist, or health care provider.  2018 Elsevier/Gold Standard (2015-02-10 12:08:50) Trastuzumab injection for infusion What is this medicine? TRASTUZUMAB (tras TOO zoo mab) is a monoclonal antibody. It is used to treat breast cancer and stomach cancer. This medicine may be used for other purposes; ask your health care provider or pharmacist if you have questions. COMMON BRAND NAME(S): Herceptin What should I tell my health care provider before I take this medicine? They need to know if you have any of these conditions: -heart disease -heart failure -lung or breathing disease, like asthma -an unusual or allergic reaction to trastuzumab, benzyl alcohol, or other medications, foods, dyes, or preservatives -pregnant or trying to get pregnant -breast-feeding How should I use this medicine? This drug is given as an infusion into a vein. It is administered in a hospital or clinic by a specially trained health care professional. Talk to your pediatrician regarding the use of this medicine in children. This medicine is not approved for use in children. Overdosage: If you think you have taken too much of this medicine contact a poison control center or emergency room at once. NOTE: This medicine is only for you. Do not share this medicine with others. What if I miss a dose? It is important not to miss a dose. Call your doctor or health care professional if you are unable to keep an appointment. What may interact with this medicine? This medicine may interact with the following medications: -certain types of chemotherapy, such as daunorubicin, doxorubicin, epirubicin, and idarubicin This list may not describe all possible interactions. Give your health care provider a list of  all the medicines, herbs, non-prescription drugs, or dietary supplements you use. Also tell them if you smoke, drink alcohol, or use illegal drugs. Some items may interact with your medicine. What should I watch for while using this medicine? Visit your doctor for checks on your progress. Report any side effects. Continue your course of treatment even though you feel ill unless your doctor tells you to stop. Call your doctor or health care professional for advice if you get a fever, chills or sore throat, or other symptoms of a cold or flu. Do not treat yourself. Try to avoid being around people who are sick. You may experience fever, chills and shaking during your first infusion. These effects are usually mild and can be treated with other medicines. Report any side effects during the infusion to your health care professional. Fever and chills usually do not happen with later infusions. Do not become pregnant while taking this medicine or for 7 months after stopping it. Women should inform their doctor if they wish to become pregnant or think they might be pregnant. Women of child-bearing potential will need to have a negative pregnancy test before starting this medicine. There is a potential for serious side effects to an unborn child. Talk to your health care professional or pharmacist for more information. Do not breast-feed an infant while taking this medicine or for 7 months after stopping it. Women must use effective birth control   with this medicine. What side effects may I notice from receiving this medicine? Side effects that you should report to your doctor or health care professional as soon as possible: -allergic reactions like skin rash, itching or hives, swelling of the face, lips, or tongue -chest pain or palpitations -cough -dizziness -feeling faint or lightheaded, falls -fever -general ill feeling or flu-like symptoms -signs of worsening heart failure like breathing problems;  swelling in your legs and feet -unusually weak or tired Side effects that usually do not require medical attention (report to your doctor or health care professional if they continue or are bothersome): -bone pain -changes in taste -diarrhea -joint pain -nausea/vomiting -weight loss This list may not describe all possible side effects. Call your doctor for medical advice about side effects. You may report side effects to FDA at 1-800-FDA-1088. Where should I keep my medicine? This drug is given in a hospital or clinic and will not be stored at home. NOTE: This sheet is a summary. It may not cover all possible information. If you have questions about this medicine, talk to your doctor, pharmacist, or health care provider.  2018 Elsevier/Gold Standard (2016-01-03 14:37:52)  

## 2017-04-25 LAB — VITAMIN D 25 HYDROXY (VIT D DEFICIENCY, FRACTURES): Vit D, 25-Hydroxy: 37.5 ng/mL (ref 30.0–100.0)

## 2017-05-15 ENCOUNTER — Inpatient Hospital Stay: Payer: BC Managed Care – PPO

## 2017-05-15 ENCOUNTER — Inpatient Hospital Stay (HOSPITAL_BASED_OUTPATIENT_CLINIC_OR_DEPARTMENT_OTHER): Payer: BC Managed Care – PPO | Admitting: Family

## 2017-05-15 VITALS — BP 145/77 | HR 91 | Temp 99.3°F | Resp 20 | Wt 172.2 lb

## 2017-05-15 DIAGNOSIS — C50911 Malignant neoplasm of unspecified site of right female breast: Secondary | ICD-10-CM | POA: Diagnosis not present

## 2017-05-15 DIAGNOSIS — C50211 Malignant neoplasm of upper-inner quadrant of right female breast: Secondary | ICD-10-CM

## 2017-05-15 DIAGNOSIS — Z170 Estrogen receptor positive status [ER+]: Secondary | ICD-10-CM | POA: Diagnosis not present

## 2017-05-15 DIAGNOSIS — E559 Vitamin D deficiency, unspecified: Secondary | ICD-10-CM

## 2017-05-15 DIAGNOSIS — Z5112 Encounter for antineoplastic immunotherapy: Secondary | ICD-10-CM | POA: Diagnosis not present

## 2017-05-15 LAB — CBC WITH DIFFERENTIAL (CANCER CENTER ONLY)
BASOS PCT: 1 %
Basophils Absolute: 0 10*3/uL (ref 0.0–0.1)
EOS PCT: 2 %
Eosinophils Absolute: 0.1 10*3/uL (ref 0.0–0.5)
HCT: 33.8 % — ABNORMAL LOW (ref 34.8–46.6)
HEMOGLOBIN: 11 g/dL — AB (ref 11.6–15.9)
LYMPHS ABS: 1.4 10*3/uL (ref 0.9–3.3)
Lymphocytes Relative: 28 %
MCH: 27.8 pg (ref 26.0–34.0)
MCHC: 32.5 g/dL (ref 32.0–36.0)
MCV: 85.4 fL (ref 81.0–101.0)
MONOS PCT: 7 %
Monocytes Absolute: 0.3 10*3/uL (ref 0.1–0.9)
NEUTROS PCT: 62 %
Neutro Abs: 3 10*3/uL (ref 1.5–6.5)
Platelet Count: 233 10*3/uL (ref 145–400)
RBC: 3.96 MIL/uL (ref 3.70–5.32)
RDW: 14.5 % (ref 11.1–15.7)
WBC Count: 4.8 10*3/uL (ref 3.9–10.0)

## 2017-05-15 LAB — CMP (CANCER CENTER ONLY)
ALBUMIN: 3.8 g/dL (ref 3.5–5.0)
ALK PHOS: 50 U/L (ref 26–84)
ALT: 39 U/L (ref 10–47)
AST: 25 U/L (ref 11–38)
Anion gap: 5 (ref 5–15)
BILIRUBIN TOTAL: 0.6 mg/dL (ref 0.2–1.6)
BUN: 12 mg/dL (ref 7–22)
CALCIUM: 9.3 mg/dL (ref 8.0–10.3)
CO2: 29 mmol/L (ref 18–33)
Chloride: 111 mmol/L — ABNORMAL HIGH (ref 98–108)
Creatinine: 0.9 mg/dL (ref 0.60–1.20)
Glucose, Bld: 93 mg/dL (ref 73–118)
Potassium: 3.6 mmol/L (ref 3.3–4.7)
SODIUM: 145 mmol/L (ref 128–145)
Total Protein: 7.2 g/dL (ref 6.4–8.1)

## 2017-05-15 MED ORDER — DIPHENHYDRAMINE HCL 25 MG PO CAPS
50.0000 mg | ORAL_CAPSULE | Freq: Once | ORAL | Status: AC
  Administered 2017-05-15: 50 mg via ORAL

## 2017-05-15 MED ORDER — TRASTUZUMAB CHEMO 150 MG IV SOLR
450.0000 mg | Freq: Once | INTRAVENOUS | Status: AC
  Administered 2017-05-15: 450 mg via INTRAVENOUS
  Filled 2017-05-15: qty 21.43

## 2017-05-15 MED ORDER — SODIUM CHLORIDE 0.9 % IV SOLN
420.0000 mg | Freq: Once | INTRAVENOUS | Status: AC
  Administered 2017-05-15: 420 mg via INTRAVENOUS
  Filled 2017-05-15: qty 14

## 2017-05-15 MED ORDER — SODIUM CHLORIDE 0.9 % IV SOLN
Freq: Once | INTRAVENOUS | Status: AC
  Administered 2017-05-15: 14:00:00 via INTRAVENOUS

## 2017-05-15 MED ORDER — DIPHENHYDRAMINE HCL 25 MG PO CAPS
ORAL_CAPSULE | ORAL | Status: AC
  Filled 2017-05-15: qty 2

## 2017-05-15 MED ORDER — HEPARIN SOD (PORK) LOCK FLUSH 100 UNIT/ML IV SOLN
500.0000 [IU] | Freq: Once | INTRAVENOUS | Status: AC | PRN
  Administered 2017-05-15: 500 [IU]
  Filled 2017-05-15: qty 5

## 2017-05-15 MED ORDER — ACETAMINOPHEN 325 MG PO TABS
ORAL_TABLET | ORAL | Status: AC
  Filled 2017-05-15: qty 2

## 2017-05-15 MED ORDER — ACETAMINOPHEN 325 MG PO TABS
650.0000 mg | ORAL_TABLET | Freq: Once | ORAL | Status: AC
  Administered 2017-05-15: 650 mg via ORAL

## 2017-05-15 MED ORDER — SODIUM CHLORIDE 0.9% FLUSH
10.0000 mL | INTRAVENOUS | Status: DC | PRN
  Administered 2017-05-15: 10 mL
  Filled 2017-05-15: qty 10

## 2017-05-15 NOTE — Patient Instructions (Signed)
[facility] Discharge Instructions for Patients Receiving Chemotherapy  Today you received the following chemotherapy agents:  Herceptin and Perjeta  To help prevent nausea and vomiting after your treatment, we encourage you to take your nausea medication as ordered per MD.    If you develop nausea and vomiting that is not controlled by your nausea medication, call the clinic.   BELOW ARE SYMPTOMS THAT SHOULD BE REPORTED IMMEDIATELY:  *FEVER GREATER THAN 100.5 F  *CHILLS WITH OR WITHOUT FEVER  NAUSEA AND VOMITING THAT IS NOT CONTROLLED WITH YOUR NAUSEA MEDICATION  *UNUSUAL SHORTNESS OF BREATH  *UNUSUAL BRUISING OR BLEEDING  TENDERNESS IN MOUTH AND THROAT WITH OR WITHOUT PRESENCE OF ULCERS  *URINARY PROBLEMS  *BOWEL PROBLEMS  UNUSUAL RASH Items with * indicate a potential emergency and should be followed up as soon as possible.  Feel free to call the clinic should you have any questions or concerns. The clinic phone number is [phone].  Please show the CHEMO ALERT CARD at check-in to the Emergency Department and triage nurse.   

## 2017-05-15 NOTE — Progress Notes (Signed)
Hematology and Oncology Follow Up Visit  GAILE ALLMON [id number] [birth year] 52 y.o. 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 Sharrar is here today for follow-up and treatment. She is doing well and staying busy with work.  No fever, chills, n/v, cough, rash, dizziness, SOB, chest pain, palpitations, abdominal pain or changes in bowel or bladder habits.  No hot flashes or night sweats.  No episodes of bleeding, no bruising or petechiae. No lymphadenopathy noted on exam.  No swelling or tenderness in her extremities. 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  No falls or syncopal episodes.  She has maintained a good appetite and is staying well hydrated. Her weight is stable.   ECOG Performance Status: 1 - Symptomatic but completely ambulatory  Medications:  Allergies as of 05/15/2017   No Known Allergies     Medication List        Accurate as of 05/15/17  1:07 PM. Always use your most recent med list.          ergocalciferol 50000 units capsule Commonly known as:  VITAMIN D2 Take 1 capsule (50,000 Units total) by mouth once a week.   letrozole 2.5 MG tablet Commonly known as:  FEMARA Take 1 tablet (2.5 mg total) by mouth daily.   valACYclovir 1000 MG tablet Commonly known as:  VALTREX Take 1,000 mg by mouth daily as needed.       Allergies: No Known Allergies  Past Medical History, Surgical history, Social history, and Family History were reviewed and updated.  Review of Systems: All other 10 point review of systems is negative.   Physical Exam:  weight is 172 lb 4 oz (78.1 kg). Her oral temperature is 99.3 F (37.4 C). Her blood pressure is 145/77 (abnormal) and her pulse is 91. Her respiration is 20.   Wt Readings from Last 3 Encounters:  05/15/17 172 lb 4 oz (78.1 kg)  04/24/17 171 lb (77.6 kg)  04/03/17 170 lb (77.1 kg)    Ocular: Sclerae unicteric, pupils equal, round and reactive to light Ear-nose-throat: Oropharynx clear, dentition fair Lymphatic: No cervical, supraclavicular or axillary adenopathy Lungs no rales or rhonchi, good excursion bilaterally Heart regular rate and rhythm, no murmur appreciated Abd soft, nontender, positive bowel sounds, no liver or spleen tip palpated on exam, no fluid wave  MSK no focal spinal tenderness, no joint edema Neuro: non-focal, well-oriented, appropriate affect Breasts: Deferred this visit, no changes per patient  Lab Results  Component Value Date   WBC 4.8 05/15/2017   HGB 11.0 (L) 05/15/2017   HCT 33.8 (L) 05/15/2017   MCV 85.4 05/15/2017   PLT 233 05/15/2017   No results found for: FERRITIN, IRON, TIBC, UIBC, IRONPCTSAT Lab Results  Component Value Date   RBC 3.96 05/15/2017   No results found for: KPAFRELGTCHN, LAMBDASER, KAPLAMBRATIO No results found for: IGGSERUM, IGA, IGMSERUM No results found for: Ronnald Ramp, A1GS, A2GS, Violet Baldy, MSPIKE, SPEI   Chemistry      Component Value Date/Time   NA 141 04/24/2017 1330   NA 144 01/09/2017  0927   K 3.5 04/24/2017 1330   K 3.4 01/09/2017 0927   CL 107 04/24/2017 1330   CL 105 01/09/2017 0927   CO2 27 04/24/2017 1330   CO2 29 01/09/2017 0927   BUN 11 04/24/2017 1330   BUN 7 01/09/2017 0927   CREATININE 0.50 (L) 04/24/2017 1330   CREATININE 0.9 01/09/2017 0927      Component Value Date/Time   CALCIUM 9.2 04/24/2017 1330   CALCIUM 9.1 01/09/2017 0927   ALKPHOS 56 04/24/2017 1330   ALKPHOS 68 01/09/2017 0927   AST 24 04/24/2017 1330   ALT 25 04/24/2017 1330   ALT 26 01/09/2017 0927    BILITOT 0.5 04/24/2017 1330      Impression and Plan: Ms. Vivero is a very pleasant 52 yo postmenopausal caucasian female with stage IIIa invasive ductal carcinoma of the right breast with lumpectomy. She is tolerating treatment with Herceptin and Perjeta. We will proceed with therapy today as planned.  She is doing well on Femara and verbalized that she is taking as prescribed.  We will get her ECHO scheduled for in the next week or so.  We will plan to see her back in another 3 weeks for follow-up and treatment.  She will contact our office with any questions or concerns. We can certainly see her sooner if need be.   [name], NP 4/24/20191:07 PM

## 2017-05-15 NOTE — Progress Notes (Signed)
Pt refused to stay for 30 minutes post perjeta. Pt without complaints at time of discharge.

## 2017-05-16 LAB — VITAMIN D 25 HYDROXY (VIT D DEFICIENCY, FRACTURES): Vit D, 25-Hydroxy: 40.2 ng/mL (ref 30.0–100.0)

## 2017-06-04 ENCOUNTER — Other Ambulatory Visit: Payer: Self-pay | Admitting: Family

## 2017-06-05 ENCOUNTER — Inpatient Hospital Stay: Payer: BC Managed Care – PPO | Attending: Hematology & Oncology | Admitting: Family

## 2017-06-05 ENCOUNTER — Inpatient Hospital Stay: Payer: BC Managed Care – PPO

## 2017-06-05 ENCOUNTER — Other Ambulatory Visit: Payer: Self-pay

## 2017-06-05 ENCOUNTER — Encounter: Payer: Self-pay | Admitting: Family

## 2017-06-05 VITALS — BP 143/85 | HR 71 | Temp 97.8°F | Resp 20 | Wt 172.0 lb

## 2017-06-05 DIAGNOSIS — Z5112 Encounter for antineoplastic immunotherapy: Secondary | ICD-10-CM | POA: Diagnosis present

## 2017-06-05 DIAGNOSIS — Z170 Estrogen receptor positive status [ER+]: Secondary | ICD-10-CM | POA: Diagnosis not present

## 2017-06-05 DIAGNOSIS — E559 Vitamin D deficiency, unspecified: Secondary | ICD-10-CM

## 2017-06-05 DIAGNOSIS — C50911 Malignant neoplasm of unspecified site of right female breast: Secondary | ICD-10-CM

## 2017-06-05 DIAGNOSIS — Z79811 Long term (current) use of aromatase inhibitors: Secondary | ICD-10-CM

## 2017-06-05 DIAGNOSIS — C50211 Malignant neoplasm of upper-inner quadrant of right female breast: Secondary | ICD-10-CM

## 2017-06-05 DIAGNOSIS — C773 Secondary and unspecified malignant neoplasm of axilla and upper limb lymph nodes: Secondary | ICD-10-CM | POA: Diagnosis present

## 2017-06-05 LAB — CBC WITH DIFFERENTIAL (CANCER CENTER ONLY)
BASOS ABS: 0 10*3/uL (ref 0.0–0.1)
Basophils Relative: 0 %
EOS PCT: 2 %
Eosinophils Absolute: 0.1 10*3/uL (ref 0.0–0.5)
HCT: 33.9 % — ABNORMAL LOW (ref 34.8–46.6)
Hemoglobin: 11.1 g/dL — ABNORMAL LOW (ref 11.6–15.9)
LYMPHS ABS: 1.4 10*3/uL (ref 0.9–3.3)
LYMPHS PCT: 26 %
MCH: 27.8 pg (ref 26.0–34.0)
MCHC: 32.7 g/dL (ref 32.0–36.0)
MCV: 85 fL (ref 81.0–101.0)
Monocytes Absolute: 0.3 10*3/uL (ref 0.1–0.9)
Monocytes Relative: 6 %
NEUTROS PCT: 66 %
Neutro Abs: 3.5 10*3/uL (ref 1.5–6.5)
PLATELETS: 220 10*3/uL (ref 145–400)
RBC: 3.99 MIL/uL (ref 3.70–5.32)
RDW: 14.7 % (ref 11.1–15.7)
WBC: 5.3 10*3/uL (ref 3.9–10.0)

## 2017-06-05 LAB — CMP (CANCER CENTER ONLY)
ALK PHOS: 50 U/L (ref 26–84)
ALT: 41 U/L (ref 10–47)
ANION GAP: 5 (ref 5–15)
AST: 28 U/L (ref 11–38)
Albumin: 3.9 g/dL (ref 3.5–5.0)
BUN: 13 mg/dL (ref 7–22)
CALCIUM: 9.2 mg/dL (ref 8.0–10.3)
CO2: 28 mmol/L (ref 18–33)
Chloride: 110 mmol/L — ABNORMAL HIGH (ref 98–108)
Creatinine: 0.7 mg/dL (ref 0.60–1.20)
GLUCOSE: 88 mg/dL (ref 73–118)
Potassium: 3.5 mmol/L (ref 3.3–4.7)
Sodium: 143 mmol/L (ref 128–145)
TOTAL PROTEIN: 7.2 g/dL (ref 6.4–8.1)
Total Bilirubin: 0.7 mg/dL (ref 0.2–1.6)

## 2017-06-05 MED ORDER — ACETAMINOPHEN 325 MG PO TABS
ORAL_TABLET | ORAL | Status: AC
  Filled 2017-06-05: qty 2

## 2017-06-05 MED ORDER — DIPHENHYDRAMINE HCL 25 MG PO CAPS
ORAL_CAPSULE | ORAL | Status: AC
  Filled 2017-06-05: qty 2

## 2017-06-05 MED ORDER — SODIUM CHLORIDE 0.9% FLUSH
10.0000 mL | INTRAVENOUS | Status: DC | PRN
  Administered 2017-06-05: 10 mL
  Filled 2017-06-05: qty 10

## 2017-06-05 MED ORDER — SODIUM CHLORIDE 0.9 % IV SOLN
420.0000 mg | Freq: Once | INTRAVENOUS | Status: AC
  Administered 2017-06-05: 420 mg via INTRAVENOUS
  Filled 2017-06-05: qty 14

## 2017-06-05 MED ORDER — ACETAMINOPHEN 325 MG PO TABS
650.0000 mg | ORAL_TABLET | Freq: Once | ORAL | Status: AC
  Administered 2017-06-05: 650 mg via ORAL

## 2017-06-05 MED ORDER — SODIUM CHLORIDE 0.9 % IV SOLN
Freq: Once | INTRAVENOUS | Status: AC
Start: 2017-06-05 — End: 2017-06-05
  Administered 2017-06-05: 14:00:00 via INTRAVENOUS

## 2017-06-05 MED ORDER — TRASTUZUMAB CHEMO 150 MG IV SOLR
450.0000 mg | Freq: Once | INTRAVENOUS | Status: AC
  Administered 2017-06-05: 450 mg via INTRAVENOUS
  Filled 2017-06-05: qty 21.43

## 2017-06-05 MED ORDER — DIPHENHYDRAMINE HCL 25 MG PO CAPS
50.0000 mg | ORAL_CAPSULE | Freq: Once | ORAL | Status: AC
  Administered 2017-06-05: 50 mg via ORAL

## 2017-06-05 MED ORDER — HEPARIN SOD (PORK) LOCK FLUSH 100 UNIT/ML IV SOLN
500.0000 [IU] | Freq: Once | INTRAVENOUS | Status: AC | PRN
  Administered 2017-06-05: 500 [IU]
  Filled 2017-06-05: qty 5

## 2017-06-05 NOTE — Progress Notes (Signed)
Hematology and Oncology Follow Up Visit  Christina Joseph [id number] 08-Apr-1965 52 y.o. 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 She is doing well and has no complaints at this time.  Breast exam today was unchanged. Lumpectomy scar at 3 o'clock position of right breast intact. No mass, lesion or rash noted. No lymphadenopathy.  No fever, chills, hot flashes, night sweats, n/v, cough, rash, dizziness, SOB, chest pain, palpitations, abdominal pain or changes in bowel or bladder habits.  No episodes of bleeding, no bruising or petechiae.  No swelling, tenderness, numbness or tingling in her extremities. No c/o pain.  She has maintained a good appetite and is staying well hydrated. Her weight is stable.   ECOG Performance Status: 1 - Symptomatic but completely ambulatory  Medications:  Allergies as of 06/05/2017   No Known Allergies     Medication List        Accurate as of 06/05/17  2:02 PM. Always use your most recent med list.          ergocalciferol 50000 units capsule Commonly known as:  VITAMIN D2 Take 1 capsule (50,000 Units total) by mouth once a week.   letrozole 2.5 MG tablet Commonly known as:  FEMARA Take 1 tablet (2.5 mg total) by mouth daily.   valACYclovir 1000 MG tablet Commonly known as:  VALTREX Take 1,000 mg by mouth daily as needed.       Allergies: No Known Allergies  Past Medical History, Surgical history, Social history, and Family History were reviewed and updated.  Review of Systems: All other 10 point  review of systems is negative.   Physical Exam:  weight is 172 lb (78 kg). Her oral temperature is 97.8 F (36.6 C). Her blood pressure is 143/85 (abnormal) and her pulse is 71. Her respiration is 20 and oxygen saturation is 100%.   Wt Readings from Last 3 Encounters:  06/05/17 172 lb (78 kg)  05/15/17 172 lb 4 oz (78.1 kg)  04/24/17 171 lb (77.6 kg)    Ocular: Sclerae unicteric, pupils equal, round and reactive to light Ear-nose-throat: Oropharynx clear, dentition fair Lymphatic: No cervical, supraclavicular or axillary adenopathy Lungs no rales or rhonchi, good excursion bilaterally Heart regular rate and rhythm, no murmur appreciated Abd soft, nontender, positive bowel sounds, no liver or spleen tip palpated on exam, no fluid wave  MSK no focal spinal tenderness, no joint edema Neuro: non-focal, well-oriented, appropriate affect Breasts: Deferred   Lab Results  Component Value Date   WBC 5.3 06/05/2017   HGB 11.1 (L) 06/05/2017   HCT 33.9 (L) 06/05/2017   MCV 85.0 06/05/2017   PLT 220 06/05/2017   No results found for: FERRITIN, IRON, TIBC, UIBC, IRONPCTSAT Lab Results  Component Value Date   RBC 3.99 06/05/2017   No results found for: KPAFRELGTCHN, LAMBDASER, KAPLAMBRATIO No results found for: IGGSERUM, IGA, IGMSERUM No results found for: Ronnald Ramp, A1GS, A2GS, Violet Baldy, MSPIKE, SPEI   Chemistry      Component Value Date/Time   NA 143 06/05/2017 1257  NA 144 01/09/2017 0927   K 3.5 06/05/2017 1257   K 3.4 01/09/2017 0927   CL 110 (H) 06/05/2017 1257   CL 105 01/09/2017 0927   CO2 28 06/05/2017 1257   CO2 29 01/09/2017 0927   BUN 13 06/05/2017 1257   BUN 7 01/09/2017 0927   CREATININE 0.70 06/05/2017 1257   CREATININE 0.9 01/09/2017 0927      Component Value Date/Time   CALCIUM 9.2 06/05/2017 1257   CALCIUM 9.1 01/09/2017 0927   ALKPHOS 50 06/05/2017 1257   ALKPHOS 68 01/09/2017 0927   AST 28 06/05/2017 1257   ALT 41  06/05/2017 1257   ALT 26 01/09/2017 0927   BILITOT 0.7 06/05/2017 1257      Impression and Plan: Christina Joseph is a very pleasant 52 yo postmenopausal caucasian female with stage IIIa invasive ductal carcinoma of the right breast with lumpectomy. She continues to do well with treatment and has no complaints at this time.  Her ECHO is scheduled for 06/25/2017.  We will proceed with treatment today as planned.  She verbalized that she is taking her Femara as prescribed and will continue this same regimen.  We will plan to see her back in another 3 weeks for follow-up and treatment.  She will contact our office with any questions or concerns. We can certainly see her sooner if need be.   [name], NP 5/15/20192:02 PM

## 2017-06-05 NOTE — Patient Instructions (Signed)
[facility] Discharge Instructions for Patients Receiving Chemotherapy  Today you received the following chemotherapy agents: Herceptin and Perjeta.  To help prevent nausea and vomiting after your treatment, we encourage you to take your nausea medication as indicated by your MD.   If you develop nausea and vomiting that is not controlled by your nausea medication, call the clinic.   BELOW ARE SYMPTOMS THAT SHOULD BE REPORTED IMMEDIATELY:  *FEVER GREATER THAN 100.5 F  *CHILLS WITH OR WITHOUT FEVER  NAUSEA AND VOMITING THAT IS NOT CONTROLLED WITH YOUR NAUSEA MEDICATION  *UNUSUAL SHORTNESS OF BREATH  *UNUSUAL BRUISING OR BLEEDING  TENDERNESS IN MOUTH AND THROAT WITH OR WITHOUT PRESENCE OF ULCERS  *URINARY PROBLEMS  *BOWEL PROBLEMS  UNUSUAL RASH Items with * indicate a potential emergency and should be followed up as soon as possible.  Feel free to call the clinic should you have any questions or concerns. The clinic phone number is [phone].  Please show the [facility] at check-in to the Emergency Department and triage nurse.

## 2017-06-05 NOTE — Patient Instructions (Signed)
Implanted Port Insertion, Care After °This sheet gives you information about how to care for yourself after your procedure. Your health care provider may also give you more specific instructions. If you have problems or questions, contact your health care provider. °What can I expect after the procedure? °After your procedure, it is common to have: °· Discomfort at the port insertion site. °· Bruising on the skin over the port. This should improve over 3-4 days. ° °Follow these instructions at home: °Port care °· After your port is placed, you will get a manufacturer's information card. The card has information about your port. Keep this card with you at all times. °· Take care of the port as told by your health care provider. Ask your health care provider if you or a family member can get training for taking care of the port at home. A home health care nurse may also take care of the port. °· Make sure to remember what type of port you have. °Incision care °· Follow instructions from your health care provider about how to take care of your port insertion site. Make sure you: °? Wash your hands with soap and water before you change your bandage (dressing). If soap and water are not available, use hand sanitizer. °? Change your dressing as told by your health care provider. °? Leave stitches (sutures), skin glue, or adhesive strips in place. These skin closures may need to stay in place for 2 weeks or longer. If adhesive strip edges start to loosen and curl up, you may trim the loose edges. Do not remove adhesive strips completely unless your health care provider tells you to do that. °· Check your port insertion site every day for signs of infection. Check for: °? More redness, swelling, or pain. °? More fluid or blood. °? Warmth. °? Pus or a bad smell. °General instructions °· Do not take baths, swim, or use a hot tub until your health care provider approves. °· Do not lift anything that is heavier than 10 lb (4.5  kg) for a week, or as told by your health care provider. °· Ask your health care provider when it is okay to: °? Return to work or school. °? Resume usual physical activities or sports. °· Do not drive for 24 hours if you were given a medicine to help you relax (sedative). °· Take over-the-counter and prescription medicines only as told by your health care provider. °· Wear a medical alert bracelet in case of an emergency. This will tell any health care providers that you have a port. °· Keep all follow-up visits as told by your health care provider. This is important. °Contact a health care provider if: °· You cannot flush your port with saline as directed, or you cannot draw blood from the port. °· You have a fever or chills. °· You have more redness, swelling, or pain around your port insertion site. °· You have more fluid or blood coming from your port insertion site. °· Your port insertion site feels warm to the touch. °· You have pus or a bad smell coming from the port insertion site. °Get help right away if: °· You have chest pain or shortness of breath. °· You have bleeding from your port that you cannot control. °Summary °· Take care of the port as told by your health care provider. °· Change your dressing as told by your health care provider. °· Keep all follow-up visits as told by your health care provider. °  This information is not intended to replace advice given to you by your health care provider. Make sure you discuss any questions you have with your health care provider. °Document Released: 10/29/2012 Document Revised: 11/30/2015 Document Reviewed: 11/30/2015 °Elsevier Interactive Patient Education © 2017 [employer] Inc[employer] ° °

## 2017-06-05 NOTE — Progress Notes (Signed)
Patient refused to wait 30 minutes after Perjeta. Released stable and asymptomatic.

## 2017-06-06 LAB — VITAMIN D 25 HYDROXY (VIT D DEFICIENCY, FRACTURES): VIT D 25 HYDROXY: 42.8 ng/mL (ref 30.0–100.0)

## 2017-06-10 ENCOUNTER — Encounter: Payer: Self-pay | Admitting: Family

## 2017-06-10 ENCOUNTER — Other Ambulatory Visit (HOSPITAL_BASED_OUTPATIENT_CLINIC_OR_DEPARTMENT_OTHER): Payer: BC Managed Care – PPO

## 2017-06-25 ENCOUNTER — Ambulatory Visit (HOSPITAL_BASED_OUTPATIENT_CLINIC_OR_DEPARTMENT_OTHER)
Admission: RE | Admit: 2017-06-25 | Discharge: 2017-06-25 | Disposition: A | Discharge status: Home / Self Care | Payer: BC Managed Care – PPO | Source: Ambulatory Visit | Attending: Hematology & Oncology | Admitting: Hematology & Oncology

## 2017-06-25 ENCOUNTER — Encounter: Payer: Self-pay | Admitting: *Deleted

## 2017-06-25 DIAGNOSIS — Z170 Estrogen receptor positive status [ER+]: Secondary | ICD-10-CM

## 2017-06-25 DIAGNOSIS — C50211 Malignant neoplasm of upper-inner quadrant of right female breast: Secondary | ICD-10-CM | POA: Diagnosis present

## 2017-06-25 NOTE — Progress Notes (Signed)
Echocardiogram 2D Echocardiogram has been performed.  Christina Joseph 06/25/2017, 11:48 AM

## 2017-06-26 ENCOUNTER — Inpatient Hospital Stay: Payer: BC Managed Care – PPO

## 2017-06-26 ENCOUNTER — Other Ambulatory Visit: Payer: Self-pay | Admitting: *Deleted

## 2017-06-26 ENCOUNTER — Other Ambulatory Visit: Payer: Self-pay

## 2017-06-26 ENCOUNTER — Inpatient Hospital Stay: Payer: BC Managed Care – PPO | Attending: Hematology & Oncology | Admitting: Family

## 2017-06-26 ENCOUNTER — Encounter: Payer: Self-pay | Admitting: Family

## 2017-06-26 DIAGNOSIS — C50911 Malignant neoplasm of unspecified site of right female breast: Secondary | ICD-10-CM

## 2017-06-26 DIAGNOSIS — Z79811 Long term (current) use of aromatase inhibitors: Secondary | ICD-10-CM | POA: Insufficient documentation

## 2017-06-26 DIAGNOSIS — Z170 Estrogen receptor positive status [ER+]: Principal | ICD-10-CM

## 2017-06-26 DIAGNOSIS — C773 Secondary and unspecified malignant neoplasm of axilla and upper limb lymph nodes: Secondary | ICD-10-CM | POA: Diagnosis present

## 2017-06-26 DIAGNOSIS — E559 Vitamin D deficiency, unspecified: Secondary | ICD-10-CM

## 2017-06-26 DIAGNOSIS — C50211 Malignant neoplasm of upper-inner quadrant of right female breast: Secondary | ICD-10-CM

## 2017-06-26 DIAGNOSIS — Z5112 Encounter for antineoplastic immunotherapy: Secondary | ICD-10-CM | POA: Insufficient documentation

## 2017-06-26 LAB — CBC WITH DIFFERENTIAL (CANCER CENTER ONLY)
Basophils Absolute: 0 10*3/uL (ref 0.0–0.1)
Basophils Relative: 1 %
Eosinophils Absolute: 0.1 10*3/uL (ref 0.0–0.5)
Eosinophils Relative: 2 %
HCT: 34.1 % — ABNORMAL LOW (ref 34.8–46.6)
Hemoglobin: 11.1 g/dL — ABNORMAL LOW (ref 11.6–15.9)
Lymphocytes Relative: 26 %
Lymphs Abs: 1.7 10*3/uL (ref 0.9–3.3)
MCH: 27.7 pg (ref 26.0–34.0)
MCHC: 32.6 g/dL (ref 32.0–36.0)
MCV: 85 fL (ref 81.0–101.0)
Monocytes Absolute: 0.3 10*3/uL (ref 0.1–0.9)
Monocytes Relative: 5 %
Neutro Abs: 4.3 10*3/uL (ref 1.5–6.5)
Neutrophils Relative %: 66 %
Platelet Count: 238 10*3/uL (ref 145–400)
RBC: 4.01 MIL/uL (ref 3.70–5.32)
RDW: 14.8 % (ref 11.1–15.7)
WBC Count: 6.4 10*3/uL (ref 3.9–10.0)

## 2017-06-26 LAB — CMP (CANCER CENTER ONLY)
ALT: 30 U/L (ref 10–47)
ANION GAP: 11 (ref 5–15)
AST: 27 U/L (ref 11–38)
Albumin: 4 g/dL (ref 3.5–5.0)
Alkaline Phosphatase: 48 U/L (ref 26–84)
BILIRUBIN TOTAL: 0.6 mg/dL (ref 0.2–1.6)
BUN: 12 mg/dL (ref 7–22)
CALCIUM: 9.3 mg/dL (ref 8.0–10.3)
CO2: 25 mmol/L (ref 18–33)
CREATININE: 0.7 mg/dL (ref 0.60–1.20)
Chloride: 105 mmol/L (ref 98–108)
GLUCOSE: 94 mg/dL (ref 73–118)
Potassium: 3.8 mmol/L (ref 3.3–4.7)
Sodium: 141 mmol/L (ref 128–145)
TOTAL PROTEIN: 7.2 g/dL (ref 6.4–8.1)

## 2017-06-26 LAB — LACTATE DEHYDROGENASE: LDH: 187 U/L (ref 125–245)

## 2017-06-26 MED ORDER — VALACYCLOVIR HCL 1 G PO TABS: 1000.0000 mg | ORAL_TABLET | Freq: Every day | ORAL | 6 refills | Status: DC | PRN

## 2017-06-26 MED ORDER — SODIUM CHLORIDE 0.9 % IV SOLN
Freq: Once | INTRAVENOUS | Status: AC
  Administered 2017-06-26: 14:00:00 via INTRAVENOUS

## 2017-06-26 MED ORDER — SODIUM CHLORIDE 0.9% FLUSH
10.0000 mL | INTRAVENOUS | Status: DC | PRN
  Administered 2017-06-26: 10 mL
  Filled 2017-06-26: qty 10

## 2017-06-26 MED ORDER — DIPHENHYDRAMINE HCL 25 MG PO CAPS
50.0000 mg | ORAL_CAPSULE | Freq: Once | ORAL | Status: AC
  Administered 2017-06-26: 50 mg via ORAL

## 2017-06-26 MED ORDER — ACETAMINOPHEN 325 MG PO TABS
650.0000 mg | ORAL_TABLET | Freq: Once | ORAL | Status: AC
  Administered 2017-06-26: 650 mg via ORAL

## 2017-06-26 MED ORDER — TRASTUZUMAB CHEMO 150 MG IV SOLR
450.0000 mg | Freq: Once | INTRAVENOUS | Status: AC
  Administered 2017-06-26: 450 mg via INTRAVENOUS
  Filled 2017-06-26: qty 21.43

## 2017-06-26 MED ORDER — ACETAMINOPHEN 325 MG PO TABS
ORAL_TABLET | ORAL | Status: AC
  Filled 2017-06-26: qty 2

## 2017-06-26 MED ORDER — SODIUM CHLORIDE 0.9 % IV SOLN
420.0000 mg | Freq: Once | INTRAVENOUS | Status: AC
  Administered 2017-06-26: 420 mg via INTRAVENOUS
  Filled 2017-06-26: qty 14

## 2017-06-26 MED ORDER — DIPHENHYDRAMINE HCL 25 MG PO CAPS
ORAL_CAPSULE | ORAL | Status: AC
  Filled 2017-06-26: qty 2

## 2017-06-26 MED ORDER — HEPARIN SOD (PORK) LOCK FLUSH 100 UNIT/ML IV SOLN
500.0000 [IU] | Freq: Once | INTRAVENOUS | Status: AC | PRN
  Administered 2017-06-26: 500 [IU]
  Filled 2017-06-26: qty 5

## 2017-06-26 NOTE — Patient Instructions (Signed)

## 2017-06-26 NOTE — Progress Notes (Signed)
Hematology and Oncology Follow Up Visit  Christina Joseph [id number] 31-Aug-1965 52 y.o. 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 She is doing well and has no complaints at this time.  She is doing well on Femara and verbalized that she is taking as prescribed.  Breast exam today was negative. No lymphadenopathy noted on exam.  No fever, chills, n/v, cough, rash, dizziness, SOB, chest pain, palpitations, abdominal pain or changes in bowel or bladder habits.  No swelling, tenderness, numbness or tingling in his extremities. No lymphadenopathy noted on exam.  She has maintained a good appetite and is staying well hydrated. Her weight is stable.   ECOG Performance Status: 1 - Symptomatic but completely ambulatory  Medications:  Allergies as of 06/26/2017   No Known Allergies     Medication List        Accurate as of 06/26/17  7:42 PM. Always use your most recent med list.          ergocalciferol 50000 units capsule Commonly known as:  VITAMIN D2 Take 1 capsule (50,000 Units total) by mouth once a week.   letrozole 2.5 MG tablet Commonly known as:  FEMARA Take 1 tablet (2.5 mg total) by mouth daily.   valACYclovir 1000 MG tablet Commonly known as:  VALTREX Take 1 tablet (1,000 mg total) by mouth daily as needed.       Allergies: No Known Allergies  Past Medical History, Surgical history, Social history, and Family History were reviewed and updated.  Review of Systems: All other 10 point review of systems is negative.   Physical  Exam:  vitals were not taken for this visit.   Wt Readings from Last 3 Encounters:  06/26/17 174 lb (78.9 kg)  06/05/17 172 lb (78 kg)  05/15/17 172 lb 4 oz (78.1 kg)    Ocular: Sclerae unicteric, pupils equal, round and reactive to light Ear-nose-throat: Oropharynx clear, dentition fair Lymphatic: No cervical, supraclavicular or axillary adenopathy Lungs no rales or rhonchi, good excursion bilaterally Heart regular rate and rhythm, no murmur appreciated Abd soft, nontender, positive bowel sounds, no liver or spleen tip palpated on exam, no fluid wave  MSK no focal spinal tenderness, no joint edema Neuro: non-focal, well-oriented, appropriate affect Breasts: No changes, no mass, lesion or rash noted   Lab Results  Component Value Date   WBC 6.4 06/26/2017   HGB 11.1 (L) 06/26/2017   HCT 34.1 (L) 06/26/2017   MCV 85.0 06/26/2017   PLT 238 06/26/2017   No results found for: FERRITIN, IRON, TIBC, UIBC, IRONPCTSAT Lab Results  Component Value Date   RBC 4.01 06/26/2017   No results found for: KPAFRELGTCHN, LAMBDASER, KAPLAMBRATIO No results found for: IGGSERUM, IGA, IGMSERUM No results found for: Ronnald Ramp, A1GS, A2GS, Tillman Sers, SPEI   Chemistry      Component Value Date/Time   NA 141 06/26/2017 1307   NA 144 01/09/2017 0927   K 3.8 06/26/2017 1307   K 3.4 01/09/2017 0927   CL 105 06/26/2017 1307  CL 105 01/09/2017 0927   CO2 25 06/26/2017 1307   CO2 29 01/09/2017 0927   BUN 12 06/26/2017 1307   BUN 7 01/09/2017 0927   CREATININE 0.70 06/26/2017 1307   CREATININE 0.9 01/09/2017 0927      Component Value Date/Time   CALCIUM 9.3 06/26/2017 1307   CALCIUM 9.1 01/09/2017 0927   ALKPHOS 48 06/26/2017 1307   ALKPHOS 68 01/09/2017 0927   AST 27 06/26/2017 1307   ALT 30 06/26/2017 1307   ALT 26 01/09/2017 0927   BILITOT 0.6 06/26/2017 1307      Impression and Plan: Christina Joseph is a very pleasant 52 yo postmenopausal caucasian female  with stage IIIa invasive ductal carcinoma of the right breast with lumpectomy. She is doing well with Herceptin and Perjeta treatment as well as daily femara. Her ECHO showed an EF of 55-60%.  We will plan to see her back in another 3 weeks for follow-up.  She will contact our office with any questions or concerns. We can certainly see her sooner if need be.   [name], NP 6/5/20197:42 PM

## 2017-06-26 NOTE — Patient Instructions (Signed)
[facility] Discharge Instructions for Patients Receiving Chemotherapy  Today you received the following chemotherapy agents:  Herceptin and Perjeta  To help prevent nausea and vomiting after your treatment, we encourage you to take your nausea medication as ordered per MD.    If you develop nausea and vomiting that is not controlled by your nausea medication, call the clinic.   BELOW ARE SYMPTOMS THAT SHOULD BE REPORTED IMMEDIATELY:  *FEVER GREATER THAN 100.5 F  *CHILLS WITH OR WITHOUT FEVER  NAUSEA AND VOMITING THAT IS NOT CONTROLLED WITH YOUR NAUSEA MEDICATION  *UNUSUAL SHORTNESS OF BREATH  *UNUSUAL BRUISING OR BLEEDING  TENDERNESS IN MOUTH AND THROAT WITH OR WITHOUT PRESENCE OF ULCERS  *URINARY PROBLEMS  *BOWEL PROBLEMS  UNUSUAL RASH Items with * indicate a potential emergency and should be followed up as soon as possible.  Feel free to call the clinic should you have any questions or concerns. The clinic phone number is [phone].  Please show the CHEMO ALERT CARD at check-in to the Emergency Department and triage nurse.   

## 2017-06-27 LAB — VITAMIN D 25 HYDROXY (VIT D DEFICIENCY, FRACTURES): Vit D, 25-Hydroxy: 36.1 ng/mL (ref 30.0–100.0)

## 2017-07-17 ENCOUNTER — Other Ambulatory Visit: Payer: Self-pay

## 2017-07-17 ENCOUNTER — Inpatient Hospital Stay: Payer: BC Managed Care – PPO

## 2017-07-17 ENCOUNTER — Encounter: Payer: Self-pay | Admitting: Hematology & Oncology

## 2017-07-17 ENCOUNTER — Inpatient Hospital Stay (HOSPITAL_BASED_OUTPATIENT_CLINIC_OR_DEPARTMENT_OTHER): Payer: BC Managed Care – PPO | Admitting: Hematology & Oncology

## 2017-07-17 VITALS — BP 135/88 | HR 83 | Temp 98.1°F | Resp 20 | Wt 175.1 lb

## 2017-07-17 DIAGNOSIS — C50911 Malignant neoplasm of unspecified site of right female breast: Secondary | ICD-10-CM

## 2017-07-17 DIAGNOSIS — N951 Menopausal and female climacteric states: Secondary | ICD-10-CM

## 2017-07-17 DIAGNOSIS — Z170 Estrogen receptor positive status [ER+]: Secondary | ICD-10-CM | POA: Diagnosis not present

## 2017-07-17 DIAGNOSIS — E559 Vitamin D deficiency, unspecified: Secondary | ICD-10-CM

## 2017-07-17 LAB — CBC WITH DIFFERENTIAL (CANCER CENTER ONLY)
BASOS ABS: 0 10*3/uL (ref 0.0–0.1)
Basophils Relative: 1 %
EOS ABS: 0.2 10*3/uL (ref 0.0–0.5)
Eosinophils Relative: 3 %
HCT: 34.4 % — ABNORMAL LOW (ref 34.8–46.6)
HEMOGLOBIN: 11.1 g/dL — AB (ref 11.6–15.9)
LYMPHS ABS: 1.4 10*3/uL (ref 0.9–3.3)
Lymphocytes Relative: 26 %
MCH: 27.8 pg (ref 26.0–34.0)
MCHC: 32.3 g/dL (ref 32.0–36.0)
MCV: 86 fL (ref 81.0–101.0)
Monocytes Absolute: 0.3 10*3/uL (ref 0.1–0.9)
Monocytes Relative: 6 %
Neutro Abs: 3.5 10*3/uL (ref 1.5–6.5)
Neutrophils Relative %: 64 %
PLATELETS: 224 10*3/uL (ref 145–400)
RBC: 4 MIL/uL (ref 3.70–5.32)
RDW: 14.9 % (ref 11.1–15.7)
WBC: 5.4 10*3/uL (ref 3.9–10.0)

## 2017-07-17 LAB — CMP (CANCER CENTER ONLY)
ALBUMIN: 3.6 g/dL (ref 3.5–5.0)
ALK PHOS: 63 U/L (ref 26–84)
ALT: 27 U/L (ref 10–47)
ANION GAP: 9 (ref 5–15)
AST: 26 U/L (ref 11–38)
BUN: 12 mg/dL (ref 7–22)
CALCIUM: 9.1 mg/dL (ref 8.0–10.3)
CO2: 28 mmol/L (ref 18–33)
Chloride: 106 mmol/L (ref 98–108)
Creatinine: 0.9 mg/dL (ref 0.60–1.20)
GLUCOSE: 135 mg/dL — AB (ref 73–118)
Potassium: 3.9 mmol/L (ref 3.3–4.7)
SODIUM: 143 mmol/L (ref 128–145)
Total Bilirubin: 0.5 mg/dL (ref 0.2–1.6)
Total Protein: 7.2 g/dL (ref 6.4–8.1)

## 2017-07-17 LAB — LACTATE DEHYDROGENASE: LDH: 188 U/L (ref 98–192)

## 2017-07-17 MED ORDER — SODIUM CHLORIDE 0.9 % IV SOLN
Freq: Once | INTRAVENOUS | Status: AC
  Administered 2017-07-17: 09:00:00 via INTRAVENOUS

## 2017-07-17 MED ORDER — HEPARIN SOD (PORK) LOCK FLUSH 100 UNIT/ML IV SOLN
500.0000 [IU] | Freq: Once | INTRAVENOUS | Status: AC | PRN
  Administered 2017-07-17: 500 [IU]
  Filled 2017-07-17: qty 5

## 2017-07-17 MED ORDER — ACETAMINOPHEN 325 MG PO TABS
650.0000 mg | ORAL_TABLET | Freq: Once | ORAL | Status: AC
  Administered 2017-07-17: 650 mg via ORAL

## 2017-07-17 MED ORDER — DIPHENHYDRAMINE HCL 25 MG PO CAPS
50.0000 mg | ORAL_CAPSULE | Freq: Once | ORAL | Status: AC
  Administered 2017-07-17: 50 mg via ORAL

## 2017-07-17 MED ORDER — TRASTUZUMAB CHEMO 150 MG IV SOLR
450.0000 mg | Freq: Once | INTRAVENOUS | Status: AC
  Administered 2017-07-17: 450 mg via INTRAVENOUS
  Filled 2017-07-17: qty 21.43

## 2017-07-17 MED ORDER — SODIUM CHLORIDE 0.9 % IV SOLN
420.0000 mg | Freq: Once | INTRAVENOUS | Status: AC
  Administered 2017-07-17: 420 mg via INTRAVENOUS
  Filled 2017-07-17: qty 14

## 2017-07-17 MED ORDER — SODIUM CHLORIDE 0.9% FLUSH
10.0000 mL | INTRAVENOUS | Status: DC | PRN
  Administered 2017-07-17: 10 mL
  Filled 2017-07-17: qty 10

## 2017-07-17 MED ORDER — ACETAMINOPHEN 325 MG PO TABS
ORAL_TABLET | ORAL | Status: AC
  Filled 2017-07-17: qty 2

## 2017-07-17 MED ORDER — DIPHENHYDRAMINE HCL 25 MG PO CAPS
ORAL_CAPSULE | ORAL | Status: AC
  Filled 2017-07-17: qty 2

## 2017-07-17 NOTE — Patient Instructions (Signed)
[facility] Discharge Instructions for Patients Receiving Chemotherapy  Today you received the following chemotherapy agents Herceptin, Perjeta.  To help prevent nausea and vomiting after your treatment, we encourage you to take your nausea medication as indicated by your MD.   If you develop nausea and vomiting that is not controlled by your nausea medication, call the clinic.   BELOW ARE SYMPTOMS THAT SHOULD BE REPORTED IMMEDIATELY:  *FEVER GREATER THAN 100.5 F  *CHILLS WITH OR WITHOUT FEVER  NAUSEA AND VOMITING THAT IS NOT CONTROLLED WITH YOUR NAUSEA MEDICATION  *UNUSUAL SHORTNESS OF BREATH  *UNUSUAL BRUISING OR BLEEDING  TENDERNESS IN MOUTH AND THROAT WITH OR WITHOUT PRESENCE OF ULCERS  *URINARY PROBLEMS  *BOWEL PROBLEMS  UNUSUAL RASH Items with * indicate a potential emergency and should be followed up as soon as possible.  Feel free to call the clinic should you have any questions or concerns. The clinic phone number is [phone].  Please show the [facility] at check-in to the Emergency Department and triage nurse.

## 2017-07-17 NOTE — Progress Notes (Signed)
Hematology and Oncology Follow Up Visit  ENDORA TERESI [id number] 1965-06-08 52 y.o. 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 Briggs is here today for follow-up and treatment.  She looks good.  She feels great.  She is had no specific complaints.  Her last echocardiogram that was done in early June showed an ejection fraction of 55-60%.  She and her family again ready go on a [place] cruise in a couple weeks.  I am sure that she will enjoy this.  She really needs to wear a lot of sunscreen as she has very fair skin.  She is had no problems with nausea or vomiting.  She does have some hot flashes.  She is on Femara.  She is doing well with Femara.  Her bowels and bladder have not changed.  Overall, her performance status is ECOG 0.     Medications:  Allergies as of 07/17/2017   No Known Allergies     Medication List        Accurate as of 07/17/17  8:09 AM. Always use your most recent med list.          ergocalciferol 50000 units capsule Commonly known as:  VITAMIN D2 Take 1 capsule (50,000 Units total) by mouth once a week.   letrozole 2.5 MG tablet Commonly known as:  FEMARA Take 1 tablet (2.5 mg total) by mouth daily.   valACYclovir 1000 MG tablet Commonly known as:  VALTREX Take 1 tablet (1,000 mg total) by mouth daily as needed.       Allergies: No Known Allergies  Past Medical History, Surgical history, Social history, and Family History were reviewed and updated.  Review of Systems: Review of Systems  Constitutional: Negative.   HENT: Negative.   Eyes:  Negative.   Respiratory: Negative.   Cardiovascular: Negative.   Gastrointestinal: Negative.   Genitourinary: Negative.   Musculoskeletal: Negative.   Skin: Negative.   Neurological: Negative.   Endo/Heme/Allergies: Negative.   Psychiatric/Behavioral: Negative.      Physical Exam:  weight is 175 lb 1.9 oz (79.4 kg). Her oral temperature is 98.1 F (36.7 C). Her blood pressure is 135/88 and her pulse is 83. Her respiration is 20 and oxygen saturation is 100%.   Wt Readings from Last 3 Encounters:  07/17/17 175 lb 1.9 oz (79.4 kg)  06/26/17 174 lb (78.9 kg)  06/05/17 172 lb (78 kg)    Physical Exam  Constitutional: She is oriented to person, place, and time.  HENT:  Head: Normocephalic and atraumatic.  Mouth/Throat: Oropharynx is clear and moist.  Eyes: Pupils are equal, round, and reactive to light. EOM are normal.  Neck: Normal range of motion.  Cardiovascular: Normal rate, regular rhythm and normal heart sounds.  Pulmonary/Chest: Effort normal and breath sounds normal.  Abdominal: Soft. Bowel sounds are normal.  Musculoskeletal: Normal range of motion. She exhibits no edema, tenderness or deformity.  Lymphadenopathy:    She has no cervical adenopathy.  Neurological: She is alert and oriented to person, place, and time.  Skin: Skin is warm and dry. No rash noted.  No erythema.  Psychiatric: She has a normal mood and affect. Her behavior is normal. Judgment and thought content normal.  Vitals reviewed.    Lab Results  Component Value Date   WBC 6.4 06/26/2017   HGB 11.1 (L) 06/26/2017   HCT 34.1 (L) 06/26/2017   MCV 85.0 06/26/2017   PLT 238 06/26/2017   No results found for: FERRITIN, IRON, TIBC, UIBC, IRONPCTSAT Lab Results  Component Value Date   RBC 4.01 06/26/2017   No results found for: KPAFRELGTCHN, LAMBDASER, KAPLAMBRATIO No results found for: IGGSERUM, IGA, IGMSERUM No results found for: Odetta Pink, SPEI   Chemistry      Component Value Date/Time   NA 141 06/26/2017 1307   NA 144 01/09/2017 0927   K 3.8 06/26/2017 1307   K 3.4 01/09/2017 0927   CL 105 06/26/2017 1307   CL 105 01/09/2017 0927   CO2 25 06/26/2017 1307   CO2 29 01/09/2017 0927   BUN 12 06/26/2017 1307   BUN 7 01/09/2017 0927   CREATININE 0.70 06/26/2017 1307   CREATININE 0.9 01/09/2017 0927      Component Value Date/Time   CALCIUM 9.3 06/26/2017 1307   CALCIUM 9.1 01/09/2017 0927   ALKPHOS 48 06/26/2017 1307   ALKPHOS 68 01/09/2017 0927   AST 27 06/26/2017 1307   ALT 30 06/26/2017 1307   ALT 26 01/09/2017 0927   BILITOT 0.6 06/26/2017 1307      Impression and Plan: Ms. Sabina is a very pleasant 52 yo postmenopausal caucasian female with stage IIIa invasive ductal carcinoma of the right breast with lumpectomy. She is doing well with Herceptin and Perjeta treatment as well as daily femara. Her ECHO showed an EF of 55-60%.   She will finish up her course of Herceptin/Perjeta in August.  At that, I will have to talk to her about using Nerlynx for 1 year.  We will plan to see her back in another 3 weeks for follow-up.   [name], MD 6/26/20198:09 AM

## 2017-07-18 LAB — VITAMIN D 25 HYDROXY (VIT D DEFICIENCY, FRACTURES): Vit D, 25-Hydroxy: 44 ng/mL (ref 30.0–100.0)

## 2017-08-07 ENCOUNTER — Ambulatory Visit: Payer: BC Managed Care – PPO | Admitting: Hematology & Oncology

## 2017-08-07 ENCOUNTER — Other Ambulatory Visit: Payer: BC Managed Care – PPO

## 2017-08-07 ENCOUNTER — Ambulatory Visit: Payer: BC Managed Care – PPO

## 2017-08-15 ENCOUNTER — Inpatient Hospital Stay: Payer: BC Managed Care – PPO

## 2017-08-15 ENCOUNTER — Encounter: Payer: Self-pay | Admitting: Hematology & Oncology

## 2017-08-15 ENCOUNTER — Inpatient Hospital Stay: Payer: BC Managed Care – PPO | Attending: Hematology & Oncology | Admitting: Hematology & Oncology

## 2017-08-15 ENCOUNTER — Other Ambulatory Visit: Payer: Self-pay

## 2017-08-15 VITALS — BP 135/83 | HR 82 | Temp 98.6°F | Resp 18 | Wt 175.0 lb

## 2017-08-15 DIAGNOSIS — Z5112 Encounter for antineoplastic immunotherapy: Secondary | ICD-10-CM | POA: Insufficient documentation

## 2017-08-15 DIAGNOSIS — C773 Secondary and unspecified malignant neoplasm of axilla and upper limb lymph nodes: Secondary | ICD-10-CM | POA: Diagnosis present

## 2017-08-15 DIAGNOSIS — C50911 Malignant neoplasm of unspecified site of right female breast: Secondary | ICD-10-CM | POA: Diagnosis present

## 2017-08-15 DIAGNOSIS — Z170 Estrogen receptor positive status [ER+]: Secondary | ICD-10-CM | POA: Diagnosis not present

## 2017-08-15 DIAGNOSIS — C50211 Malignant neoplasm of upper-inner quadrant of right female breast: Secondary | ICD-10-CM

## 2017-08-15 DIAGNOSIS — Z79811 Long term (current) use of aromatase inhibitors: Secondary | ICD-10-CM | POA: Insufficient documentation

## 2017-08-15 LAB — CBC WITH DIFFERENTIAL (CANCER CENTER ONLY)
BASOS PCT: 0 %
Basophils Absolute: 0 10*3/uL (ref 0.0–0.1)
EOS ABS: 0.2 10*3/uL (ref 0.0–0.5)
EOS PCT: 3 %
HCT: 33.1 % — ABNORMAL LOW (ref 34.8–46.6)
Hemoglobin: 10.7 g/dL — ABNORMAL LOW (ref 11.6–15.9)
LYMPHS ABS: 1.4 10*3/uL (ref 0.9–3.3)
Lymphocytes Relative: 27 %
MCH: 28.2 pg (ref 26.0–34.0)
MCHC: 32.3 g/dL (ref 32.0–36.0)
MCV: 87.3 fL (ref 81.0–101.0)
MONOS PCT: 7 %
Monocytes Absolute: 0.4 10*3/uL (ref 0.1–0.9)
Neutro Abs: 3.4 10*3/uL (ref 1.5–6.5)
Neutrophils Relative %: 63 %
Platelet Count: 230 10*3/uL (ref 145–400)
RBC: 3.79 MIL/uL (ref 3.70–5.32)
RDW: 14.9 % (ref 11.1–15.7)
WBC Count: 5.3 10*3/uL (ref 3.9–10.0)

## 2017-08-15 LAB — CMP (CANCER CENTER ONLY)
ALBUMIN: 3.6 g/dL (ref 3.5–5.0)
ALT: 34 U/L (ref 10–47)
AST: 31 U/L (ref 11–38)
Alkaline Phosphatase: 57 U/L (ref 26–84)
Anion gap: 8 (ref 5–15)
BILIRUBIN TOTAL: 0.4 mg/dL (ref 0.2–1.6)
BUN: 11 mg/dL (ref 7–22)
CALCIUM: 9.4 mg/dL (ref 8.0–10.3)
CHLORIDE: 107 mmol/L (ref 98–108)
CO2: 27 mmol/L (ref 18–33)
CREATININE: 0.6 mg/dL (ref 0.60–1.20)
Glucose, Bld: 105 mg/dL (ref 73–118)
Potassium: 3.6 mmol/L (ref 3.3–4.7)
SODIUM: 142 mmol/L (ref 128–145)
Total Protein: 6.8 g/dL (ref 6.4–8.1)

## 2017-08-15 MED ORDER — DENOSUMAB 60 MG/ML ~~LOC~~ SOSY
60.0000 mg | PREFILLED_SYRINGE | Freq: Once | SUBCUTANEOUS | Status: AC
  Administered 2017-08-15: 60 mg via SUBCUTANEOUS

## 2017-08-15 MED ORDER — TRASTUZUMAB CHEMO 150 MG IV SOLR
450.0000 mg | Freq: Once | INTRAVENOUS | Status: AC
  Administered 2017-08-15: 450 mg via INTRAVENOUS
  Filled 2017-08-15: qty 21.4

## 2017-08-15 MED ORDER — DENOSUMAB 60 MG/ML ~~LOC~~ SOSY
PREFILLED_SYRINGE | SUBCUTANEOUS | Status: AC
  Filled 2017-08-15: qty 1

## 2017-08-15 MED ORDER — ACETAMINOPHEN 325 MG PO TABS
ORAL_TABLET | ORAL | Status: AC
  Filled 2017-08-15: qty 2

## 2017-08-15 MED ORDER — HEPARIN SOD (PORK) LOCK FLUSH 100 UNIT/ML IV SOLN
500.0000 [IU] | Freq: Once | INTRAVENOUS | Status: AC | PRN
  Administered 2017-08-15: 500 [IU]
  Filled 2017-08-15: qty 5

## 2017-08-15 MED ORDER — ACETAMINOPHEN 325 MG PO TABS
650.0000 mg | ORAL_TABLET | Freq: Once | ORAL | Status: AC
  Administered 2017-08-15: 650 mg via ORAL

## 2017-08-15 MED ORDER — SODIUM CHLORIDE 0.9 % IJ SOLN
10.0000 mL | Freq: Once | INTRAMUSCULAR | Status: AC
  Administered 2017-08-15: 10 mL
  Filled 2017-08-15: qty 10

## 2017-08-15 MED ORDER — SODIUM CHLORIDE 0.9 % IV SOLN
420.0000 mg | Freq: Once | INTRAVENOUS | Status: AC
  Administered 2017-08-15: 420 mg via INTRAVENOUS
  Filled 2017-08-15: qty 14

## 2017-08-15 MED ORDER — SODIUM CHLORIDE 0.9 % IV SOLN
Freq: Once | INTRAVENOUS | Status: AC
  Administered 2017-08-15: 09:00:00 via INTRAVENOUS
  Filled 2017-08-15: qty 250

## 2017-08-15 MED ORDER — DIPHENHYDRAMINE HCL 25 MG PO CAPS
50.0000 mg | ORAL_CAPSULE | Freq: Once | ORAL | Status: AC
Start: 2017-08-15 — End: 2017-08-15
  Administered 2017-08-15: 50 mg via ORAL

## 2017-08-15 MED ORDER — SODIUM CHLORIDE 0.9% FLUSH
10.0000 mL | INTRAVENOUS | Status: DC | PRN
  Administered 2017-08-15: 10 mL
  Filled 2017-08-15: qty 10

## 2017-08-15 MED ORDER — DIPHENHYDRAMINE HCL 25 MG PO CAPS
ORAL_CAPSULE | ORAL | Status: AC
Start: 2017-08-15 — End: ?
  Filled 2017-08-15: qty 2

## 2017-08-15 NOTE — Patient Instructions (Signed)
[facility] Discharge Instructions for Patients Receiving Chemotherapy  Today you received the following chemotherapy agents Hereptin?Perjeta To help prevent nausea and vomiting after your treatment, we encourage you to take your nausea medication as prescribed.  If you develop nausea and vomiting that is not controlled by your nausea medication, call the clinic.   BELOW ARE SYMPTOMS THAT SHOULD BE REPORTED IMMEDIATELY:  *FEVER GREATER THAN 100.5 F  *CHILLS WITH OR WITHOUT FEVER  NAUSEA AND VOMITING THAT IS NOT CONTROLLED WITH YOUR NAUSEA MEDICATION  *UNUSUAL SHORTNESS OF BREATH  *UNUSUAL BRUISING OR BLEEDING  TENDERNESS IN MOUTH AND THROAT WITH OR WITHOUT PRESENCE OF ULCERS  *URINARY PROBLEMS  *BOWEL PROBLEMS  UNUSUAL RASH Items with * indicate a potential emergency and should be followed up as soon as possible.  Feel free to call the clinic should you have any questions or concerns. The clinic phone number is [phone].  Please show the [facility] at check-in to the Emergency Department and triage nurse.

## 2017-08-15 NOTE — Progress Notes (Signed)
Patient refuses to stay for 30 minute post Perjeta observation. Patient discharged ambulatory without complaints or concerns.

## 2017-08-15 NOTE — Progress Notes (Signed)
Hematology and Oncology Follow Up Visit  Christina Joseph [id number] [birth year] 52 y.o. 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  She had a good time on a cruise.  She was on a [place] cruise to the [place].  She really enjoyed it.  Unfortunately she did get a little sunburn.  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  I think she we will start back up the middle of the month.  Her last echocardiogram was done in June.  This looked fine with an ejection fraction of 55-60%.  I did talk to her little bit about Nerlynx.  I think she would be a good candidate for Nerlynx.  She is worried about the diarrhea that can happen with Nerlynx.  I told her there was a protocol that we use to try to help minimize the diarrhea.  She is had no bleeding.  There is been no fever.  She has had no change in bowel or bladder habits.  Overall, her performance status is ECOG 0.    Medications:  Allergies as of 08/15/2017   No Known Allergies     Medication List        Accurate as of 08/15/17  8:10 AM. Always use your most recent med list.          ergocalciferol 50000 units capsule Commonly known as:  VITAMIN D2 Take 1 capsule (50,000 Units total) by mouth once a week.   letrozole 2.5 MG tablet Commonly known as:  FEMARA Take 1 tablet (2.5 mg total) by mouth daily.   valACYclovir 1000 MG tablet Commonly known as:  VALTREX Take 1 tablet (1,000 mg total) by mouth daily as needed.       Allergies: No Known Allergies  Past  Medical History, Surgical history, Social history, and Family History were reviewed and updated.  Review of Systems: Review of Systems  Constitutional: Negative.   HENT: Negative.   Eyes: Negative.   Respiratory: Negative.   Cardiovascular: Negative.   Gastrointestinal: Negative.   Genitourinary: Negative.   Musculoskeletal: Negative.   Skin: Negative.   Neurological: Negative.   Endo/Heme/Allergies: Negative.   Psychiatric/Behavioral: Negative.      Physical Exam:  weight is 175 lb (79.4 kg). Her oral temperature is 98.6 F (37 C). Her blood pressure is 135/83 and her pulse is 82. Her respiration is 18 and oxygen saturation is 99%.   Wt Readings from Last 3 Encounters:  08/15/17 175 lb (79.4 kg)  07/17/17 175 lb 1.9 oz (79.4 kg)  06/26/17 174 lb (78.9 kg)    Physical Exam  Constitutional: She is oriented to person, place, and time.  HENT:  Head: Normocephalic and atraumatic.  Mouth/Throat: Oropharynx is clear and moist.  Eyes: Pupils are equal, round, and reactive to light. EOM are normal.  Neck: Normal range of motion.  Cardiovascular: Normal rate, regular rhythm and normal heart sounds.  Pulmonary/Chest: Effort normal and breath sounds normal.  Abdominal: Soft. Bowel sounds are normal.  Musculoskeletal: Normal range of motion.  She exhibits no edema, tenderness or deformity.  Lymphadenopathy:    She has no cervical adenopathy.  Neurological: She is alert and oriented to person, place, and time.  Skin: Skin is warm and dry. No rash noted. No erythema.  Psychiatric: She has a normal mood and affect. Her behavior is normal. Judgment and thought content normal.  Vitals reviewed.    Lab Results  Component Value Date   WBC 5.3 08/15/2017   HGB 10.7 (L) 08/15/2017   HCT 33.1 (L) 08/15/2017   MCV 87.3 08/15/2017   PLT 230 08/15/2017   No results found for: FERRITIN, IRON, TIBC, UIBC, IRONPCTSAT Lab Results  Component Value Date   RBC 3.79 08/15/2017   No  results found for: KPAFRELGTCHN, LAMBDASER, KAPLAMBRATIO No results found for: IGGSERUM, IGA, IGMSERUM No results found for: Odetta Pink, SPEI   Chemistry      Component Value Date/Time   NA 143 07/17/2017 0805   NA 144 01/09/2017 0927   K 3.9 07/17/2017 0805   K 3.4 01/09/2017 0927   CL 106 07/17/2017 0805   CL 105 01/09/2017 0927   CO2 28 07/17/2017 0805   CO2 29 01/09/2017 0927   BUN 12 07/17/2017 0805   BUN 7 01/09/2017 0927   CREATININE 0.90 07/17/2017 0805   CREATININE 0.9 01/09/2017 0927      Component Value Date/Time   CALCIUM 9.1 07/17/2017 0805   CALCIUM 9.1 01/09/2017 0927   ALKPHOS 63 07/17/2017 0805   ALKPHOS 68 01/09/2017 0927   AST 26 07/17/2017 0805   ALT 27 07/17/2017 0805   ALT 26 01/09/2017 0927   BILITOT 0.5 07/17/2017 0805      Impression and Plan: Christina Joseph is a very pleasant 52 yo postmenopausal caucasian female with stage IIIa invasive ductal carcinoma of the right breast with lumpectomy.  We will proceed with her Herceptin and Perjeta today.  She will have one final dose in August.  I probably will give her a couple months off before I would consider her for the Nerlynx.  She is on Femara and doing well with Femara.  I will see her back in 3 weeks.  [name], MD 7/25/20198:10 AM

## 2017-08-15 NOTE — Patient Instructions (Signed)
Implanted Port Home Guide An implanted port is a type of central line that is placed under the skin. Central lines are used to provide IV access when treatment or nutrition needs to be given through a person's veins. Implanted ports are used for long-term IV access. An implanted port may be placed because:  You need IV medicine that would be irritating to the small veins in your hands or arms.  You need long-term IV medicines, such as antibiotics.  You need IV nutrition for a long period.  You need frequent blood draws for lab tests.  You need dialysis.  Implanted ports are usually placed in the chest area, but they can also be placed in the upper arm, the abdomen, or the leg. An implanted port has two main parts:  Reservoir. The reservoir is round and will appear as a small, raised area under your skin. The reservoir is the part where a needle is inserted to give medicines or draw blood.  Catheter. The catheter is a thin, flexible tube that extends from the reservoir. The catheter is placed into a large vein. Medicine that is inserted into the reservoir goes into the catheter and then into the vein.  How will I care for my incision site? Do not get the incision site wet. Bathe or shower as directed by your health care provider. How is my port accessed? Special steps must be taken to access the port:  Before the port is accessed, a numbing cream can be placed on the skin. This helps numb the skin over the port site.  Your health care provider uses a sterile technique to access the port. ? Your health care provider must put on a mask and sterile gloves. ? The skin over your port is cleaned carefully with an antiseptic and allowed to dry. ? The port is gently pinched between sterile gloves, and a needle is inserted into the port.  Only "non-coring" port needles should be used to access the port. Once the port is accessed, a blood return should be checked. This helps ensure that the port  is in the vein and is not clogged.  If your port needs to remain accessed for a constant infusion, a clear (transparent) bandage will be placed over the needle site. The bandage and needle will need to be changed every week, or as directed by your health care provider.  Keep the bandage covering the needle clean and dry. Do not get it wet. Follow your health care provider's instructions on how to take a shower or bath while the port is accessed.  If your port does not need to stay accessed, no bandage is needed over the port.  What is flushing? Flushing helps keep the port from getting clogged. Follow your health care provider's instructions on how and when to flush the port. Ports are usually flushed with saline solution or a medicine called heparin. The need for flushing will depend on how the port is used.  If the port is used for intermittent medicines or blood draws, the port will need to be flushed: ? After medicines have been given. ? After blood has been drawn. ? As part of routine maintenance.  If a constant infusion is running, the port may not need to be flushed.  How long will my port stay implanted? The port can stay in for as long as your health care provider thinks it is needed. When it is time for the port to come out, surgery will be   done to remove it. The procedure is similar to the one performed when the port was put in. When should I seek immediate medical care? When you have an implanted port, you should seek immediate medical care if:  You notice a bad smell coming from the incision site.  You have swelling, redness, or drainage at the incision site.  You have more swelling or pain at the port site or the surrounding area.  You have a fever that is not controlled with medicine.  This information is not intended to replace advice given to you by your health care provider. Make sure you discuss any questions you have with your health care provider. Document  Released: 01/08/2005 Document Revised: 06/16/2015 Document Reviewed: 09/15/2012 Elsevier Interactive Patient Education  2017 [employer]  

## 2017-08-26 ENCOUNTER — Other Ambulatory Visit: Payer: Self-pay | Admitting: Hematology & Oncology

## 2017-08-28 ENCOUNTER — Ambulatory Visit: Payer: BC Managed Care – PPO

## 2017-08-28 ENCOUNTER — Other Ambulatory Visit: Payer: BC Managed Care – PPO

## 2017-08-28 ENCOUNTER — Ambulatory Visit: Payer: BC Managed Care – PPO | Admitting: Hematology & Oncology

## 2017-09-04 ENCOUNTER — Other Ambulatory Visit: Payer: BC Managed Care – PPO

## 2017-09-04 ENCOUNTER — Ambulatory Visit: Payer: BC Managed Care – PPO

## 2017-09-04 ENCOUNTER — Ambulatory Visit: Payer: BC Managed Care – PPO | Admitting: Hematology & Oncology

## 2017-09-10 ENCOUNTER — Inpatient Hospital Stay (HOSPITAL_BASED_OUTPATIENT_CLINIC_OR_DEPARTMENT_OTHER): Payer: BC Managed Care – PPO | Admitting: Family

## 2017-09-10 ENCOUNTER — Telehealth: Payer: Self-pay | Admitting: Pharmacist

## 2017-09-10 ENCOUNTER — Inpatient Hospital Stay: Payer: BC Managed Care – PPO

## 2017-09-10 ENCOUNTER — Inpatient Hospital Stay: Payer: BC Managed Care – PPO | Attending: Hematology & Oncology

## 2017-09-10 ENCOUNTER — Other Ambulatory Visit: Payer: Self-pay

## 2017-09-10 VITALS — BP 134/82 | HR 77 | Temp 98.4°F | Resp 20 | Wt 174.5 lb

## 2017-09-10 DIAGNOSIS — C50911 Malignant neoplasm of unspecified site of right female breast: Secondary | ICD-10-CM

## 2017-09-10 DIAGNOSIS — Z170 Estrogen receptor positive status [ER+]: Secondary | ICD-10-CM

## 2017-09-10 DIAGNOSIS — Z5112 Encounter for antineoplastic immunotherapy: Secondary | ICD-10-CM | POA: Insufficient documentation

## 2017-09-10 DIAGNOSIS — C773 Secondary and unspecified malignant neoplasm of axilla and upper limb lymph nodes: Secondary | ICD-10-CM | POA: Diagnosis present

## 2017-09-10 DIAGNOSIS — E559 Vitamin D deficiency, unspecified: Secondary | ICD-10-CM

## 2017-09-10 LAB — CBC WITH DIFFERENTIAL (CANCER CENTER ONLY)
BASOS ABS: 0 10*3/uL (ref 0.0–0.1)
BASOS PCT: 0 %
EOS ABS: 0.1 10*3/uL (ref 0.0–0.5)
Eosinophils Relative: 2 %
HCT: 34.5 % — ABNORMAL LOW (ref 34.8–46.6)
Hemoglobin: 11.2 g/dL — ABNORMAL LOW (ref 11.6–15.9)
Lymphocytes Relative: 25 %
Lymphs Abs: 1.2 10*3/uL (ref 0.9–3.3)
MCH: 28.1 pg (ref 26.0–34.0)
MCHC: 32.5 g/dL (ref 32.0–36.0)
MCV: 86.7 fL (ref 81.0–101.0)
MONO ABS: 0.3 10*3/uL (ref 0.1–0.9)
MONOS PCT: 7 %
Neutro Abs: 3.2 10*3/uL (ref 1.5–6.5)
Neutrophils Relative %: 66 %
PLATELETS: 224 10*3/uL (ref 145–400)
RBC: 3.98 MIL/uL (ref 3.70–5.32)
RDW: 14.3 % (ref 11.1–15.7)
WBC Count: 4.8 10*3/uL (ref 3.9–10.0)

## 2017-09-10 LAB — CMP (CANCER CENTER ONLY)
ALT: 29 U/L (ref 10–47)
AST: 29 U/L (ref 11–38)
Albumin: 3.7 g/dL (ref 3.5–5.0)
Alkaline Phosphatase: 64 U/L (ref 26–84)
Anion gap: 4 — ABNORMAL LOW (ref 5–15)
BUN: 11 mg/dL (ref 7–22)
CHLORIDE: 107 mmol/L (ref 98–108)
CO2: 30 mmol/L (ref 18–33)
CREATININE: 0.7 mg/dL (ref 0.60–1.20)
Calcium: 8.3 mg/dL (ref 8.0–10.3)
GLUCOSE: 109 mg/dL (ref 73–118)
Potassium: 4 mmol/L (ref 3.3–4.7)
SODIUM: 141 mmol/L (ref 128–145)
Total Bilirubin: 0.6 mg/dL (ref 0.2–1.6)
Total Protein: 7 g/dL (ref 6.4–8.1)

## 2017-09-10 MED ORDER — SODIUM CHLORIDE 0.9 % IV SOLN
Freq: Once | INTRAVENOUS | Status: AC
  Administered 2017-09-10: 10:00:00 via INTRAVENOUS
  Filled 2017-09-10: qty 250

## 2017-09-10 MED ORDER — TRASTUZUMAB CHEMO 150 MG IV SOLR
450.0000 mg | Freq: Once | INTRAVENOUS | Status: AC
  Administered 2017-09-10: 450 mg via INTRAVENOUS
  Filled 2017-09-10: qty 21.43

## 2017-09-10 MED ORDER — SODIUM CHLORIDE 0.9% FLUSH
10.0000 mL | INTRAVENOUS | Status: DC | PRN
  Administered 2017-09-10: 10 mL
  Filled 2017-09-10: qty 10

## 2017-09-10 MED ORDER — HEPARIN SOD (PORK) LOCK FLUSH 100 UNIT/ML IV SOLN
500.0000 [IU] | Freq: Once | INTRAVENOUS | Status: AC | PRN
  Administered 2017-09-10: 500 [IU]
  Filled 2017-09-10: qty 5

## 2017-09-10 MED ORDER — ACETAMINOPHEN 325 MG PO TABS
650.0000 mg | ORAL_TABLET | Freq: Once | ORAL | Status: AC
  Administered 2017-09-10: 650 mg via ORAL

## 2017-09-10 MED ORDER — DIPHENHYDRAMINE HCL 25 MG PO CAPS
50.0000 mg | ORAL_CAPSULE | Freq: Once | ORAL | Status: AC
  Administered 2017-09-10: 50 mg via ORAL

## 2017-09-10 MED ORDER — ACETAMINOPHEN 325 MG PO TABS
ORAL_TABLET | ORAL | Status: AC
  Filled 2017-09-10: qty 2

## 2017-09-10 MED ORDER — DIPHENHYDRAMINE HCL 25 MG PO CAPS
ORAL_CAPSULE | ORAL | Status: AC
  Filled 2017-09-10: qty 2

## 2017-09-10 MED ORDER — SODIUM CHLORIDE 0.9 % IV SOLN
420.0000 mg | Freq: Once | INTRAVENOUS | Status: AC
  Administered 2017-09-10: 420 mg via INTRAVENOUS
  Filled 2017-09-10: qty 14

## 2017-09-10 NOTE — Progress Notes (Addendum)
Hematology and Oncology Follow Up Visit  Christina TRELOAR [id number] 1965/09/27 52 y.o. 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 She is doing well and has no complaints at this time. She had a great first week back in school.  No changes noted on breast exam today.  No lymphadenopathy.  She has had no issue with fever, chills, n/v, cough, rash, dizziness, SOB, chest pain, palpitations, abdominal pain or changes in bowel or bladder habits.  No episodes of bleeding, no bruising or petechiae.  No swelling, tenderness, numbness or tingling in her extremities at this time.  She has maintained a good appetite and is staying well hydrated. Her weight is stable.   ECOG Performance Status: 1 - Symptomatic but completely ambulatory  Medications:  Allergies as of 09/10/2017   No Known Allergies     Medication List        Accurate as of 09/10/17 12:00 PM. Always use your most recent med list.          letrozole 2.5 MG tablet Commonly known as:  FEMARA Take 1 tablet (2.5 mg total) by mouth daily.   valACYclovir 1000 MG tablet Commonly known as:  VALTREX Take 1 tablet (1,000 mg total) by mouth daily as needed.   Vitamin D (Ergocalciferol) 50000 units Caps capsule Commonly known as:  DRISDOL TAKE 1 CAPSULE BY MOUTH 1 TIME A WEEK       Allergies: No Known Allergies  Past Medical History, Surgical history, Social history, and Family History were reviewed and updated.  Review of Systems: All other 10 point review of systems is negative.    Physical Exam:  weight is 174 lb 8 oz (79.2 kg). Her oral temperature is 98.4 F (36.9 C). Her blood pressure is 134/82 and her pulse is 77. Her respiration is 20 and oxygen saturation is 99%.   Wt Readings from Last 3 Encounters:  09/10/17 174 lb 8 oz (79.2 kg)  08/15/17 175 lb (79.4 kg)  07/17/17 175 lb 1.9 oz (79.4 kg)    Ocular: Sclerae unicteric, pupils equal, round and reactive to light Ear-nose-throat: Oropharynx clear, dentition fair Lymphatic: No cervical, supraclavicular or axillary adenopathy Lungs no rales or rhonchi, good excursion bilaterally Heart regular rate and rhythm, no murmur appreciated Abd soft, nontender, positive bowel sounds, no liver or spleen tip palpated on exam, no fluid wave MSK no focal spinal tenderness, no joint edema Neuro: non-focal, well-oriented, appropriate affect Breasts: No changes, no mass, lesion or rash   Lab Results  Component Value Date   WBC 4.8 09/10/2017   HGB 11.2 (L) 09/10/2017   HCT 34.5 (L) 09/10/2017   MCV 86.7 09/10/2017   PLT 224 09/10/2017   No results found for: FERRITIN, IRON, TIBC, UIBC, IRONPCTSAT Lab Results  Component Value Date   RBC 3.98 09/10/2017   No results found for: KPAFRELGTCHN, LAMBDASER, KAPLAMBRATIO No results found for: IGGSERUM, IGA, IGMSERUM No results found for: TOTALPROTELP, ALBUMINELP, A1GS, A2GS, BETS, BETA2SER, GAMS, MSPIKE, SPEI   Chemistry      Component Value Date/Time  NA 141 09/10/2017 0855   NA 144 01/09/2017 0927   K 4.0 09/10/2017 0855   K 3.4 01/09/2017 0927   CL 107 09/10/2017 0855   CL 105 01/09/2017 0927   CO2 30 09/10/2017 0855   CO2 29 01/09/2017 0927   BUN 11 09/10/2017 0855   BUN 7 01/09/2017 0927   CREATININE 0.70 09/10/2017 0855   CREATININE 0.9 01/09/2017 0927      Component Value Date/Time   CALCIUM 8.3 09/10/2017 0855   CALCIUM 9.1 01/09/2017 0927   ALKPHOS 64 09/10/2017 0855   ALKPHOS 68 01/09/2017 0927   AST 29 09/10/2017 0855   ALT 29 09/10/2017  0855   ALT 26 01/09/2017 0927   BILITOT 0.6 09/10/2017 0855      Impression and Plan: Christina Joseph is a very pleasant 52 yo postmenopausal caucasian female with stage IIIa invasive ductal carcinoma of the right breast with lumpectomy.  She is tolerating treatment well and we will proceed with today's cycle as planned.  She has one more cycle in 3 weeks and then will transition to Nerlynx. We discussed side effects today and her concern about possible diarrhea. We will make sure to get her onto Loperamide as well to help prevent this. We will see her back in 3 weeks.  She will contact our office with any questions or concerns. We can certainly see her sooner if need be.    [name], NP 8/20/201912:00 PM

## 2017-09-10 NOTE — Telephone Encounter (Signed)
Femara  2.5 mg   30 tabs/bottle 1 bottle Lot:  [id number]      Exp: 9/19

## 2017-09-10 NOTE — Patient Instructions (Signed)
[facility] Discharge Instructions for Patients Receiving Chemotherapy  Today you received the following chemotherapy agents Herceptin/Perjeta To help prevent nausea and vomiting after your treatment, we encourage you to take your nausea medication as prescribed.   If you develop nausea and vomiting that is not controlled by your nausea medication, call the clinic.   BELOW ARE SYMPTOMS THAT SHOULD BE REPORTED IMMEDIATELY:  *FEVER GREATER THAN 100.5 F  *CHILLS WITH OR WITHOUT FEVER  NAUSEA AND VOMITING THAT IS NOT CONTROLLED WITH YOUR NAUSEA MEDICATION  *UNUSUAL SHORTNESS OF BREATH  *UNUSUAL BRUISING OR BLEEDING  TENDERNESS IN MOUTH AND THROAT WITH OR WITHOUT PRESENCE OF ULCERS  *URINARY PROBLEMS  *BOWEL PROBLEMS  UNUSUAL RASH Items with * indicate a potential emergency and should be followed up as soon as possible.  Feel free to call the clinic should you have any questions or concerns. The clinic phone number is [phone].  Please show the CHEMO ALERT CARD at check-in to the Emergency Department and triage nurse.   

## 2017-09-10 NOTE — Progress Notes (Signed)
Pt refused to stay for 30 minute observation post Perjeta.  Pt without complaints at time of discharge.

## 2017-09-10 NOTE — Progress Notes (Signed)
Patient refused to stay for 30 minute post Perjeta observation. Patient discharged ambulatory without complaints or concerns.

## 2017-09-18 ENCOUNTER — Other Ambulatory Visit: Payer: BC Managed Care – PPO

## 2017-09-18 ENCOUNTER — Ambulatory Visit: Payer: BC Managed Care – PPO | Admitting: Hematology & Oncology

## 2017-09-18 ENCOUNTER — Ambulatory Visit: Payer: BC Managed Care – PPO

## 2017-09-25 ENCOUNTER — Other Ambulatory Visit: Payer: BC Managed Care – PPO

## 2017-09-25 ENCOUNTER — Ambulatory Visit: Payer: BC Managed Care – PPO

## 2017-09-25 ENCOUNTER — Ambulatory Visit: Payer: BC Managed Care – PPO | Admitting: Hematology & Oncology

## 2017-09-30 ENCOUNTER — Inpatient Hospital Stay: Payer: BC Managed Care – PPO | Attending: Hematology & Oncology | Admitting: Hematology & Oncology

## 2017-09-30 ENCOUNTER — Other Ambulatory Visit: Payer: Self-pay

## 2017-09-30 ENCOUNTER — Inpatient Hospital Stay: Payer: BC Managed Care – PPO

## 2017-09-30 ENCOUNTER — Encounter: Payer: Self-pay | Admitting: Hematology & Oncology

## 2017-09-30 ENCOUNTER — Ambulatory Visit: Payer: BC Managed Care – PPO

## 2017-09-30 VITALS — BP 147/87 | HR 84 | Temp 98.0°F | Resp 18 | Wt 175.0 lb

## 2017-09-30 DIAGNOSIS — Z170 Estrogen receptor positive status [ER+]: Principal | ICD-10-CM

## 2017-09-30 DIAGNOSIS — C50911 Malignant neoplasm of unspecified site of right female breast: Secondary | ICD-10-CM | POA: Insufficient documentation

## 2017-09-30 DIAGNOSIS — C773 Secondary and unspecified malignant neoplasm of axilla and upper limb lymph nodes: Secondary | ICD-10-CM | POA: Diagnosis present

## 2017-09-30 DIAGNOSIS — E559 Vitamin D deficiency, unspecified: Secondary | ICD-10-CM

## 2017-09-30 DIAGNOSIS — Z95828 Presence of other vascular implants and grafts: Secondary | ICD-10-CM

## 2017-09-30 LAB — CBC WITH DIFFERENTIAL (CANCER CENTER ONLY)
Basophils Absolute: 0 10*3/uL (ref 0.0–0.1)
Basophils Relative: 0 %
Eosinophils Absolute: 0.1 10*3/uL (ref 0.0–0.5)
Eosinophils Relative: 2 %
HCT: 34.5 % — ABNORMAL LOW (ref 34.8–46.6)
HEMOGLOBIN: 10.9 g/dL — AB (ref 11.6–15.9)
LYMPHS ABS: 1.8 10*3/uL (ref 0.9–3.3)
Lymphocytes Relative: 22 %
MCH: 27.5 pg (ref 26.0–34.0)
MCHC: 31.6 g/dL — AB (ref 32.0–36.0)
MCV: 87.1 fL (ref 81.0–101.0)
MONOS PCT: 6 %
Monocytes Absolute: 0.5 10*3/uL (ref 0.1–0.9)
NEUTROS ABS: 5.8 10*3/uL (ref 1.5–6.5)
NEUTROS PCT: 70 %
Platelet Count: 239 10*3/uL (ref 145–400)
RBC: 3.96 MIL/uL (ref 3.70–5.32)
RDW: 14.3 % (ref 11.1–15.7)
WBC: 8.3 10*3/uL (ref 3.9–10.0)

## 2017-09-30 LAB — LACTATE DEHYDROGENASE: LDH: 196 U/L — ABNORMAL HIGH (ref 98–192)

## 2017-09-30 LAB — CMP (CANCER CENTER ONLY)
ALK PHOS: 57 U/L (ref 26–84)
ALT: 26 U/L (ref 10–47)
AST: 22 U/L (ref 11–38)
Albumin: 3.9 g/dL (ref 3.5–5.0)
Anion gap: 1 — ABNORMAL LOW (ref 5–15)
BILIRUBIN TOTAL: 0.4 mg/dL (ref 0.2–1.6)
BUN: 11 mg/dL (ref 7–22)
CO2: 26 mmol/L (ref 18–33)
Calcium: 9.3 mg/dL (ref 8.0–10.3)
Chloride: 111 mmol/L — ABNORMAL HIGH (ref 98–108)
Creatinine: 0.8 mg/dL (ref 0.60–1.20)
Glucose, Bld: 89 mg/dL (ref 73–118)
POTASSIUM: 3.8 mmol/L (ref 3.3–4.7)
SODIUM: 138 mmol/L (ref 128–145)
Total Protein: 7.2 g/dL (ref 6.4–8.1)

## 2017-09-30 MED ORDER — HEPARIN SOD (PORK) LOCK FLUSH 100 UNIT/ML IV SOLN
500.0000 [IU] | Freq: Once | INTRAVENOUS | Status: AC
  Administered 2017-09-30: 500 [IU] via INTRAVENOUS
  Filled 2017-09-30: qty 5

## 2017-09-30 MED ORDER — NERATINIB MALEATE 40 MG PO TABS: 240.0000 mg | ORAL_TABLET | Freq: Every day | ORAL | 11 refills | Status: DC

## 2017-09-30 MED ORDER — LOPERAMIDE HCL 2 MG PO CAPS: ORAL_CAPSULE | ORAL | 12 refills | Status: DC

## 2017-09-30 MED ORDER — SODIUM CHLORIDE 0.9% FLUSH
10.0000 mL | INTRAVENOUS | Status: DC | PRN
  Administered 2017-09-30: 10 mL via INTRAVENOUS
  Filled 2017-09-30: qty 10

## 2017-09-30 NOTE — Progress Notes (Signed)
Hematology and Oncology Follow Up Visit  Christina Joseph [id number] 1965/02/15 52 y.o. 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  She looks great.  She is doing well.  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  She really enjoys this.  We now have to get her on Nerlynx.  I think this would clearly be the treatment of choice for her.  She had 3+ lymph nodes.  She has completed adjuvant Herceptin/Perjeta.  I think the Nerlynx would help further decrease risk of recurrence.   She is very worried about the Nerlynx causing diarrhea.  We will make sure that she has Imodium.  I told her how to take the Imodium with the Nerlynx.  I told her that for the first 14 days to take 4 mg 3 times a day.  After that, she is to take 4 mg twice a day.  She is doing well on Femara.  She may have some slight hand stiffness in the morning.  Otherwise, she really has no other problems with the Femara.  She had a good weekend.  She is eating well.  She is exercising.  She is had no problems with bowels or bladder.  She is had no cough.  There is been no headache.  Overall, I would say that her performance status is ECOG 0.    Medications:  Allergies as of 09/30/2017   No Known Allergies     Medication List        Accurate as of 09/30/17  2:54 PM. Always use your most recent med list.          letrozole 2.5 MG tablet Commonly known as:  FEMARA Take 1 tablet (2.5 mg total) by mouth daily.     valACYclovir 1000 MG tablet Commonly known as:  VALTREX Take 1 tablet (1,000 mg total) by mouth daily as needed.   Vitamin D (Ergocalciferol) 50000 units Caps capsule Commonly known as:  DRISDOL TAKE 1 CAPSULE BY MOUTH 1 TIME A WEEK       Allergies: No Known Allergies  Past Medical History, Surgical history, Social history, and Family History were reviewed and updated.  Review of Systems: Review of Systems  Constitutional: Negative.   HENT: Negative.   Eyes: Negative.   Respiratory: Negative.   Cardiovascular: Negative.  Palpitations: .phys.  Gastrointestinal: Negative.   Genitourinary: Negative.   Musculoskeletal: Negative.   Skin: Negative.   Neurological: Negative.   Endo/Heme/Allergies: Negative.   Psychiatric/Behavioral: Negative.       Physical Exam:  weight is 175 lb (79.4 kg). Her oral temperature is 98 F (36.7 C). Her blood pressure is 147/87 (abnormal) and her pulse is 84. Her respiration is 18 and oxygen saturation is 99%.   Wt Readings from Last 3 Encounters:  09/30/17 175 lb (79.4 kg)  09/10/17 174 lb 8 oz (79.2 kg)  08/15/17 175 lb (79.4 kg)   Physical Exam  Constitutional: She is oriented  to person, place, and time.  Breast exam shows left breast with no masses, edema or erythema.  There is no left axillary adenopathy.  Right breast is slightly contracted from the surgery and radiation therapy.  She has a well healed lumpectomy at the 3 o'clock position.  There is some slight firmness of the lumpectomy site.  There is no distinct mass.  She has a well-healed right axillary lymphadenectomy scar.  There is no right axillary adenopathy.  HENT:  Head: Normocephalic and atraumatic.  Mouth/Throat: Oropharynx is clear and moist.  Eyes: Pupils are equal, round, and reactive to light. EOM are normal.  Neck: Normal range of motion.  Cardiovascular: Normal rate, regular rhythm and normal heart sounds.  Pulmonary/Chest: Effort normal and breath sounds normal.   Abdominal: Soft. Bowel sounds are normal.  Musculoskeletal: Normal range of motion. She exhibits no edema, tenderness or deformity.  Lymphadenopathy:    She has no cervical adenopathy.  Neurological: She is alert and oriented to person, place, and time.  Skin: Skin is warm and dry. No rash noted. No erythema.  Psychiatric: She has a normal mood and affect. Her behavior is normal. Judgment and thought content normal.  Vitals reviewed.   .  Lab Results  Component Value Date   WBC 8.3 09/30/2017   HGB 10.9 (L) 09/30/2017   HCT 34.5 (L) 09/30/2017   MCV 87.1 09/30/2017   PLT 239 09/30/2017   No results found for: FERRITIN, IRON, TIBC, UIBC, IRONPCTSAT Lab Results  Component Value Date   RBC 3.96 09/30/2017   No results found for: KPAFRELGTCHN, LAMBDASER, KAPLAMBRATIO No results found for: IGGSERUM, IGA, IGMSERUM No results found for: Christina Joseph, SPEI   Chemistry      Component Value Date/Time   NA 138 09/30/2017 1310   NA 144 01/09/2017 0927   K 3.8 09/30/2017 1310   K 3.4 01/09/2017 0927   CL 111 (H) 09/30/2017 1310   CL 105 01/09/2017 0927   CO2 26 09/30/2017 1310   CO2 29 01/09/2017 0927   BUN 11 09/30/2017 1310   BUN 7 01/09/2017 0927   CREATININE 0.80 09/30/2017 1310   CREATININE 0.9 01/09/2017 0927      Component Value Date/Time   CALCIUM 9.3 09/30/2017 1310   CALCIUM 9.1 01/09/2017 0927   ALKPHOS 57 09/30/2017 1310   ALKPHOS 68 01/09/2017 0927   AST 22 09/30/2017 1310   ALT 26 09/30/2017 1310   ALT 26 01/09/2017 0927   BILITOT 0.4 09/30/2017 1310      Impression and Plan: [name] is a very pleasant 52 yo postmenopausal caucasian female with stage IIIa invasive ductal carcinoma of the right breast with lumpectomy.   We will go ahead and get her on her legs.  Hopefully, she will not have issues with diarrhea.  We will continue the Femara.  I would like to see her back in a month.  By then, we  should know how well she is doing.  I told her to make sure that she calls this if diarrhea is a real problem.     [name], MD 9/9/20192:54 PM

## 2017-10-01 ENCOUNTER — Telehealth: Payer: Self-pay | Admitting: Pharmacist

## 2017-10-01 ENCOUNTER — Telehealth: Payer: Self-pay | Admitting: *Deleted

## 2017-10-01 ENCOUNTER — Telehealth: Payer: Self-pay | Admitting: Pharmacy Technician

## 2017-10-01 DIAGNOSIS — C50911 Malignant neoplasm of unspecified site of right female breast: Secondary | ICD-10-CM

## 2017-10-01 DIAGNOSIS — Z170 Estrogen receptor positive status [ER+]: Principal | ICD-10-CM

## 2017-10-01 LAB — VITAMIN D 25 HYDROXY (VIT D DEFICIENCY, FRACTURES): Vit D, 25-Hydroxy: 31.1 ng/mL (ref 30.0–100.0)

## 2017-10-01 MED ORDER — LOPERAMIDE HCL 2 MG PO CAPS: ORAL_CAPSULE | ORAL | 0 refills | Status: DC

## 2017-10-01 MED ORDER — NERATINIB MALEATE 40 MG PO TABS: 240.0000 mg | ORAL_TABLET | Freq: Every day | ORAL | 11 refills | Status: DC

## 2017-10-01 MED ORDER — AMOXICILLIN 500 MG PO TABS: ORAL_TABLET | ORAL | 3 refills | Status: DC

## 2017-10-01 NOTE — Telephone Encounter (Signed)
Patient broke a tooth while eating and has a dental appointment this Friday. They're anticipating pulling the tooth and placing an implant. She'd like to know if there are any precautions in which she should take.   Dr Marin Olp would like patient to take Amoxicillin 2g one hour prior to procedure.  Patient is aware of instruction and new prescription.

## 2017-10-01 NOTE — Telephone Encounter (Signed)
Oral Oncology Patient Advocate Encounter  Received notification from [facility] that prior authorization for Nerlynx is required.  PA submitted on CoverMyMeds Key [id number] Status is pending  Oral Oncology Clinic will continue to follow.  [place] Patient [place] Phone [phone] Fax [phone] 10/01/2017 12:40 PM

## 2017-10-01 NOTE — Telephone Encounter (Signed)
Oral Oncology Patient Advocate Encounter  Prior Authorization for Nerlynx has been approved.    PA# [id number] Effective dates: 10/01/17 through 10/02/18.  I have faxed the approval letter to [employer] [phone]).  Oral Oncology Clinic will continue to follow.  [place] Patient [place] Phone [phone] Fax [phone] 10/01/2017 12:45 PM

## 2017-10-01 NOTE — Telephone Encounter (Signed)
Oral Oncology Pharmacist Encounter  Received new prescription for Nerlynx (neratinib) for extended adjuvant treatment of early stage disease with HER2 overexpression, following adjuvant trastuzumab based therapy, planned duration of 1 years.  CMP from 09/30/17 assessed, no relevant lab abnormalities. Prescription dose and frequency assessed.   Current medication list in Epic reviewed, no DDIs with neratinib identified.  Due to the limited distribution of neratinib, [facility] can not dispense neratinib. Prescription has been e-scribed to [facility].  Oral Oncology Clinic will continue to follow for insurance authorization, copayment issues, initial counseling and start date.  Darl Pikes, PharmD, BCPS, BCOP Hematology/Oncology Clinical Pharmacist [facility] [phone]  10/01/2017 8:00 AM

## 2017-10-02 ENCOUNTER — Other Ambulatory Visit: Payer: Self-pay | Admitting: *Deleted

## 2017-10-02 DIAGNOSIS — C50911 Malignant neoplasm of unspecified site of right female breast: Secondary | ICD-10-CM

## 2017-10-02 DIAGNOSIS — Z170 Estrogen receptor positive status [ER+]: Principal | ICD-10-CM

## 2017-10-02 MED ORDER — LOPERAMIDE HCL 2 MG PO CAPS: ORAL_CAPSULE | ORAL | 0 refills | Status: DC

## 2017-10-02 NOTE — Telephone Encounter (Signed)
Oral Chemotherapy Pharmacist Encounter   [facility] does not dispense loperamide to go along with the Nerlynx, it is not on their formulary. I have rerouted Ms. Marling's loperamide prescription to her local pharmacy of choice. Ms. Camarena is aware of this. Patient advocate Bethena Roys will follow-up with the local pharmacy and give them the voucher information for 3 months of free loperamide.   Darl Pikes, PharmD, BCPS, [facility] Hematology/Oncology Clinical Pharmacist [facility] [facility] [phone]  10/02/2017 10:01 AM

## 2017-10-10 ENCOUNTER — Encounter: Payer: Self-pay | Admitting: *Deleted

## 2017-10-10 ENCOUNTER — Telehealth: Payer: Self-pay | Admitting: *Deleted

## 2017-10-10 NOTE — Telephone Encounter (Signed)
Patient is c/o diarrhea after starting NERLYNX last Friday, September 13th.   The diarrhea started on Sunday, but became severe yesterday. She is currently having a liquid stool about every 20-30 minutes. She is taking her imodium, 2 tablets TID as prescribed. She is eating and drinking well. She has no other complaints.  Spoke with Dr Marin Olp. He would like patient to continue the imodium dosing. She is to hold NERLYNX until Monday. She is to restart her NERLYNX per the following:  Monday September 23 - 3 tablets daily for one week Monday September 30 - increase to 4 tablets daily for one week Monday October 7 - increase to 6 tablets daily  She is to call if the diarrhea returns during any of these bump ups.   Left specific instructions on her personal voice mail. Also sent instructions via MyChart. She is instructed to call or email back to confirm understanding.

## 2017-10-25 ENCOUNTER — Encounter: Payer: Self-pay | Admitting: Family

## 2017-10-28 ENCOUNTER — Telehealth: Payer: Self-pay | Admitting: *Deleted

## 2017-10-28 NOTE — Telephone Encounter (Signed)
Patient is still experiencing loose stools after holding and slowly increasing her NERLYNX. She is currently at 4 tablets daily, but is supposed to increase her dose to 6 tablets. She is taking 6 tablets of imodium daily.  Spoke with Dr Marin Olp. He wants patient to increase her imodium to 2 tablets,  4 times daily and to keep her NERLYNX at 4 tablets daily. She is to continue this until the diarrhea resolves.   Patient is aware of instruction. Confirmed with teach back.

## 2017-11-08 ENCOUNTER — Other Ambulatory Visit: Payer: Self-pay | Admitting: Hematology & Oncology

## 2017-11-11 ENCOUNTER — Telehealth: Payer: Self-pay | Admitting: *Deleted

## 2017-11-11 NOTE — Telephone Encounter (Signed)
Patient c/o continued loose stools. She is taking the imodium as prescribed with no change in stools. She is missing work. She is having to wear adult diapers. She has lost 10 pounds. She doesn't feel like she can continue this medication due to side effects.  Instructed patient to stop taking her Nerlynx. We will schedule a follow up appointment with Dr Marin Olp where they can determine another treatment option for her. She understood.  Message sent to scheduler.

## 2017-11-12 ENCOUNTER — Telehealth: Payer: Self-pay | Admitting: Hematology & Oncology

## 2017-11-12 ENCOUNTER — Other Ambulatory Visit: Payer: Self-pay | Admitting: Hematology & Oncology

## 2017-11-12 DIAGNOSIS — Z170 Estrogen receptor positive status [ER+]: Principal | ICD-10-CM

## 2017-11-12 DIAGNOSIS — C50911 Malignant neoplasm of unspecified site of right female breast: Secondary | ICD-10-CM

## 2017-11-12 NOTE — Telephone Encounter (Signed)
lmom for pt to return call to office to confirm 11/25/17 at 12 pm appt per sch msg

## 2017-11-25 ENCOUNTER — Inpatient Hospital Stay: Payer: BC Managed Care – PPO

## 2017-11-25 ENCOUNTER — Other Ambulatory Visit: Payer: Self-pay

## 2017-11-25 ENCOUNTER — Inpatient Hospital Stay: Payer: BC Managed Care – PPO | Attending: Hematology & Oncology | Admitting: Hematology & Oncology

## 2017-11-25 ENCOUNTER — Encounter: Payer: Self-pay | Admitting: Hematology & Oncology

## 2017-11-25 VITALS — BP 144/86 | HR 81 | Temp 98.2°F | Resp 18 | Wt 161.0 lb

## 2017-11-25 DIAGNOSIS — C50911 Malignant neoplasm of unspecified site of right female breast: Secondary | ICD-10-CM

## 2017-11-25 DIAGNOSIS — Z95828 Presence of other vascular implants and grafts: Secondary | ICD-10-CM

## 2017-11-25 DIAGNOSIS — Z170 Estrogen receptor positive status [ER+]: Secondary | ICD-10-CM

## 2017-11-25 LAB — CMP (CANCER CENTER ONLY)
ALK PHOS: 40 U/L (ref 26–84)
ALT: 24 U/L (ref 10–47)
ANION GAP: 5 (ref 5–15)
AST: 24 U/L (ref 11–38)
Albumin: 3.9 g/dL (ref 3.5–5.0)
BILIRUBIN TOTAL: 0.5 mg/dL (ref 0.2–1.6)
BUN: 11 mg/dL (ref 7–22)
CO2: 26 mmol/L (ref 18–33)
Calcium: 8.9 mg/dL (ref 8.0–10.3)
Chloride: 109 mmol/L — ABNORMAL HIGH (ref 98–108)
Creatinine: 0.9 mg/dL (ref 0.60–1.20)
Glucose, Bld: 99 mg/dL (ref 73–118)
POTASSIUM: 3.6 mmol/L (ref 3.3–4.7)
SODIUM: 140 mmol/L (ref 128–145)
TOTAL PROTEIN: 6.9 g/dL (ref 6.4–8.1)

## 2017-11-25 LAB — CBC WITH DIFFERENTIAL (CANCER CENTER ONLY)
Abs Immature Granulocytes: 0.01 10*3/uL (ref 0.00–0.07)
Basophils Absolute: 0 10*3/uL (ref 0.0–0.1)
Basophils Relative: 1 %
EOS PCT: 1 %
Eosinophils Absolute: 0 10*3/uL (ref 0.0–0.5)
HEMATOCRIT: 35.4 % — AB (ref 36.0–46.0)
HEMOGLOBIN: 11 g/dL — AB (ref 12.0–15.0)
Immature Granulocytes: 0 %
LYMPHS ABS: 1.2 10*3/uL (ref 0.7–4.0)
Lymphocytes Relative: 20 %
MCH: 26.7 pg (ref 26.0–34.0)
MCHC: 31.1 g/dL (ref 30.0–36.0)
MCV: 85.9 fL (ref 80.0–100.0)
MONO ABS: 0.3 10*3/uL (ref 0.1–1.0)
MONOS PCT: 5 %
Neutro Abs: 4.2 10*3/uL (ref 1.7–7.7)
Neutrophils Relative %: 73 %
Platelet Count: 234 10*3/uL (ref 150–400)
RBC: 4.12 MIL/uL (ref 3.87–5.11)
RDW: 13.9 % (ref 11.5–15.5)
WBC Count: 5.8 10*3/uL (ref 4.0–10.5)
nRBC: 0 % (ref 0.0–0.2)

## 2017-11-25 LAB — LACTATE DEHYDROGENASE: LDH: 189 U/L (ref 98–192)

## 2017-11-25 MED ORDER — SODIUM CHLORIDE 0.9% FLUSH
10.0000 mL | INTRAVENOUS | Status: DC | PRN
  Administered 2017-11-25: 10 mL via INTRAVENOUS
  Filled 2017-11-25: qty 10

## 2017-11-25 MED ORDER — HEPARIN SOD (PORK) LOCK FLUSH 100 UNIT/ML IV SOLN
500.0000 [IU] | Freq: Once | INTRAVENOUS | Status: AC
  Administered 2017-11-25: 500 [IU] via INTRAVENOUS
  Filled 2017-11-25: qty 5

## 2017-11-25 NOTE — Progress Notes (Signed)
Hematology and Oncology Follow Up Visit  Christina Joseph [id number] 10-02-1965 52 y.o. 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  She was having a lot of problems with the Nerlynx.  The diarrhea just was not getting better.  However, she found that she change the Imodium brand, that this seemed to work.  As such, she we will continue to take the Nerlynx.  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  She has had no problems with cough or shortness of breath.  She is doing well with the Femara.  She is had no leg swelling.  She is had no urinary issues.  Overall, I would say that her performance status is ECOG 1.    Medications:  Allergies as of 11/25/2017   No Known Allergies     Medication List        Accurate as of 11/25/17  1:36 PM. Always use your most recent med list.          amoxicillin 500 MG tablet Commonly known as:  AMOXIL Take 2083m once, one hour prior to the dental procedure.   letrozole 2.5 MG tablet Commonly known as:  FEMARA Take 1 tablet (2.5 mg total) by mouth daily.   loperamide 2 MG capsule Commonly known as:  IMODIUM TAKE 2 CAPSULES BY MOUTH THREE TIMES DAILY FOR 2 WEEKS THEN TAKE 2 CAPSULES BY MOUTH TWICE DAILY FOR 6 WEEKS AS NEEDED   Neratinib Maleate 40 MG tablet Commonly known as:  NERLYNX Take 6 tablets (240 mg total) by mouth daily. Take with food.     valACYclovir 1000 MG tablet Commonly known as:  VALTREX Take 1 tablet (1,000 mg total) by mouth daily as needed.   Vitamin D (Ergocalciferol) 50000 units Caps capsule Commonly known as:  DRISDOL TAKE 1 CAPSULE BY MOUTH 1 TIME A WEEK       Allergies: No Known Allergies  Past Medical History, Surgical history, Social history, and Family History were reviewed and updated.  Review of Systems: Review of Systems  Constitutional: Negative.   HENT: Negative.   Eyes: Negative.   Respiratory: Negative.   Cardiovascular: Negative.  Palpitations: .phys.  Gastrointestinal: Negative.   Genitourinary: Negative.   Musculoskeletal: Negative.   Skin: Negative.   Neurological: Negative.   Endo/Heme/Allergies: Negative.   Psychiatric/Behavioral: Negative.       Physical Exam:  weight is 161 lb (73 kg). Her oral temperature is 98.2 F (36.8 C). Her blood pressure is 144/86 (abnormal) and her pulse is 81. Her respiration is 18 and oxygen saturation is 99%.   Wt Readings from Last 3 Encounters:  11/25/17 161 lb (73 kg)  09/30/17 175 lb (79.4 kg)  09/10/17 174 lb 8 oz (79.2 kg)   Physical Exam  Constitutional: She is oriented to person, place, and time.  Breast exam shows left breast with no masses, edema  or erythema.  There is no left axillary adenopathy.  Right breast is slightly contracted from the surgery and radiation therapy.  She has a well healed lumpectomy at the 3 o'clock position.  There is some slight firmness of the lumpectomy site.  There is no distinct mass.  She has a well-healed right axillary lymphadenectomy scar.  There is no right axillary adenopathy.  HENT:  Head: Normocephalic and atraumatic.  Mouth/Throat: Oropharynx is clear and moist.  Eyes: Pupils are equal, round, and reactive to light. EOM are normal.  Neck: Normal range of motion.  Cardiovascular: Normal rate, regular rhythm and normal heart sounds.  Pulmonary/Chest: Effort normal and breath sounds normal.   Abdominal: Soft. Bowel sounds are normal.  Musculoskeletal: Normal range of motion. She exhibits no edema, tenderness or deformity.  Lymphadenopathy:    She has no cervical adenopathy.  Neurological: She is alert and oriented to person, place, and time.  Skin: Skin is warm and dry. No rash noted. No erythema.  Psychiatric: She has a normal mood and affect. Her behavior is normal. Judgment and thought content normal.  Vitals reviewed.   .  Lab Results  Component Value Date   WBC 5.8 11/25/2017   HGB 11.0 (L) 11/25/2017   HCT 35.4 (L) 11/25/2017   MCV 85.9 11/25/2017   PLT 234 11/25/2017   No results found for: FERRITIN, IRON, TIBC, UIBC, IRONPCTSAT Lab Results  Component Value Date   RBC 4.12 11/25/2017   No results found for: KPAFRELGTCHN, LAMBDASER, KAPLAMBRATIO No results found for: IGGSERUM, IGA, IGMSERUM No results found for: Odetta Pink, SPEI   Chemistry      Component Value Date/Time   NA 140 11/25/2017 1148   NA 144 01/09/2017 0927   K 3.6 11/25/2017 1148   K 3.4 01/09/2017 0927   CL 109 (H) 11/25/2017 1148   CL 105 01/09/2017 0927   CO2 26 11/25/2017 1148   CO2 29 01/09/2017 0927   BUN 11 11/25/2017 1148   BUN 7 01/09/2017 0927   CREATININE 0.90 11/25/2017 1148   CREATININE 0.9 01/09/2017 0927      Component Value Date/Time   CALCIUM 8.9 11/25/2017 1148   CALCIUM 9.1 01/09/2017 0927   ALKPHOS 40 11/25/2017 1148   ALKPHOS 68 01/09/2017 0927   AST 24 11/25/2017 1148   ALT 24 11/25/2017 1148   ALT 26 01/09/2017 0927   BILITOT 0.5 11/25/2017 1148      Impression and Plan: Christina Joseph is a very pleasant 52 yo postmenopausal caucasian female with stage IIIa invasive ductal carcinoma of the right breast with lumpectomy.   I am so happy that she we will continue the Nerlynx.  She is happy that she will be able to continue the Nerlynx.  I will plan to get her back in 6 weeks.  I want to make sure that  everything will be okay as we are going to the holiday season.     [name], MD 11/4/20191:36 PM

## 2017-12-13 ENCOUNTER — Other Ambulatory Visit: Payer: Self-pay | Admitting: Hematology & Oncology

## 2018-01-06 ENCOUNTER — Other Ambulatory Visit: Payer: BC Managed Care – PPO

## 2018-01-06 ENCOUNTER — Ambulatory Visit: Payer: BC Managed Care – PPO | Admitting: Hematology & Oncology

## 2018-01-08 ENCOUNTER — Encounter: Payer: Self-pay | Admitting: Hematology & Oncology

## 2018-01-08 ENCOUNTER — Other Ambulatory Visit: Payer: Self-pay

## 2018-01-08 ENCOUNTER — Inpatient Hospital Stay: Payer: BC Managed Care – PPO

## 2018-01-08 ENCOUNTER — Inpatient Hospital Stay: Payer: BC Managed Care – PPO | Attending: Hematology & Oncology

## 2018-01-08 ENCOUNTER — Inpatient Hospital Stay (HOSPITAL_BASED_OUTPATIENT_CLINIC_OR_DEPARTMENT_OTHER): Payer: BC Managed Care – PPO | Admitting: Hematology & Oncology

## 2018-01-08 VITALS — BP 137/89 | HR 86 | Temp 97.7°F | Resp 20 | Wt 161.1 lb

## 2018-01-08 DIAGNOSIS — Z170 Estrogen receptor positive status [ER+]: Secondary | ICD-10-CM | POA: Diagnosis not present

## 2018-01-08 DIAGNOSIS — Z79899 Other long term (current) drug therapy: Secondary | ICD-10-CM

## 2018-01-08 DIAGNOSIS — Z79811 Long term (current) use of aromatase inhibitors: Secondary | ICD-10-CM

## 2018-01-08 DIAGNOSIS — C50911 Malignant neoplasm of unspecified site of right female breast: Secondary | ICD-10-CM | POA: Diagnosis present

## 2018-01-08 DIAGNOSIS — Z923 Personal history of irradiation: Secondary | ICD-10-CM | POA: Diagnosis not present

## 2018-01-08 LAB — CBC WITH DIFFERENTIAL (CANCER CENTER ONLY)
Abs Immature Granulocytes: 0.01 10*3/uL (ref 0.00–0.07)
BASOS ABS: 0 10*3/uL (ref 0.0–0.1)
Basophils Relative: 1 %
EOS PCT: 2 %
Eosinophils Absolute: 0.1 10*3/uL (ref 0.0–0.5)
HEMATOCRIT: 34 % — AB (ref 36.0–46.0)
HEMOGLOBIN: 10.7 g/dL — AB (ref 12.0–15.0)
Immature Granulocytes: 0 %
LYMPHS ABS: 1.8 10*3/uL (ref 0.7–4.0)
LYMPHS PCT: 28 %
MCH: 27.6 pg (ref 26.0–34.0)
MCHC: 31.5 g/dL (ref 30.0–36.0)
MCV: 87.6 fL (ref 80.0–100.0)
MONO ABS: 0.4 10*3/uL (ref 0.1–1.0)
Monocytes Relative: 6 %
Neutro Abs: 4 10*3/uL (ref 1.7–7.7)
Neutrophils Relative %: 63 %
Platelet Count: 240 10*3/uL (ref 150–400)
RBC: 3.88 MIL/uL (ref 3.87–5.11)
RDW: 14.6 % (ref 11.5–15.5)
WBC Count: 6.3 10*3/uL (ref 4.0–10.5)
nRBC: 0 % (ref 0.0–0.2)

## 2018-01-08 LAB — CMP (CANCER CENTER ONLY)
ALBUMIN: 4.3 g/dL (ref 3.5–5.0)
ALK PHOS: 51 U/L (ref 38–126)
ALT: 16 U/L (ref 0–44)
ANION GAP: 7 (ref 5–15)
AST: 17 U/L (ref 15–41)
BUN: 17 mg/dL (ref 6–20)
CO2: 27 mmol/L (ref 22–32)
Calcium: 9.3 mg/dL (ref 8.9–10.3)
Chloride: 105 mmol/L (ref 98–111)
Creatinine: 0.8 mg/dL (ref 0.44–1.00)
GFR, Est AFR Am: >60 mL/min (ref >60)
GLUCOSE: 77 mg/dL (ref 70–99)
Potassium: 3.8 mmol/L (ref 3.5–5.1)
Sodium: 139 mmol/L (ref 135–145)
TOTAL PROTEIN: 6.7 g/dL (ref 6.5–8.1)
Total Bilirubin: 0.3 mg/dL (ref 0.3–1.2)

## 2018-01-08 MED ORDER — SODIUM CHLORIDE 0.9% FLUSH
10.0000 mL | INTRAVENOUS | Status: DC | PRN
  Administered 2018-01-08: 10 mL via INTRAVENOUS
  Filled 2018-01-08: qty 10

## 2018-01-08 MED ORDER — HEPARIN SOD (PORK) LOCK FLUSH 100 UNIT/ML IV SOLN
500.0000 [IU] | Freq: Once | INTRAVENOUS | Status: AC
  Administered 2018-01-08: 500 [IU] via INTRAVENOUS
  Filled 2018-01-08: qty 5

## 2018-01-08 NOTE — Patient Instructions (Signed)

## 2018-01-08 NOTE — Progress Notes (Signed)
Hematology and Oncology Follow Up Visit  JACOLE CAPLEY [id number] May 11, 1965 52 y.o. 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 Selvage is here today for follow-up.  She is doing quite well.  The diarrhea really is no longer a problem.  I am just very happy for her.  She really persisted and the diarrhea is no longer an issue.  She had a very nice Thanksgiving.  She is looking forward to Christmas.  She is still working.  She works at [employer] of her school.  She is excited about next year.  She actually is going to retire on November 02, 2025.  She has had no issues with cough or shortness of breath.  There is been no rashes.  She has had no arthralgias from the Femara.  She has had no headache.  Overall, her performance status is ECOG 1.    Medications:  Allergies as of 01/08/2018   No Known Allergies     Medication List       Accurate as of January 08, 2018  3:23 PM. Always use your most recent med list.        amoxicillin 500 MG tablet Commonly known as:  AMOXIL Take '2000mg'$  once, one hour prior to the dental procedure.   letrozole 2.5 MG tablet Commonly known as:  FEMARA Take 1 tablet (2.5 mg total) by mouth daily.   loperamide 2 MG capsule Commonly known as:  IMODIUM TAKE 2 CAPSULES BY MOUTH THREE TIMES DAILY FOR 2 WEEKS THEN TAKE 2 CAPSULES BY MOUTH TWICE DAILY FOR 6 WEEKS AS NEEDED   Neratinib Maleate 40 MG tablet Commonly known as:  NERLYNX Take 6 tablets (240 mg total) by mouth daily. Take with food.     valACYclovir 1000 MG tablet Commonly known as:  VALTREX Take 1 tablet (1,000 mg total) by mouth daily as needed.   Vitamin D (Ergocalciferol) 1.25 MG (50000 UT) Caps capsule Commonly known as:  DRISDOL TAKE 1 CAPSULE BY MOUTH 1 TIME A WEEK       Allergies: No Known Allergies  Past Medical History, Surgical history, Social history, and Family History were reviewed and updated.  Review of Systems: Review of Systems  Constitutional: Negative.   HENT: Negative.   Eyes: Negative.   Respiratory: Negative.   Cardiovascular: Negative.  Palpitations: .phys.  Gastrointestinal: Negative.   Genitourinary: Negative.   Musculoskeletal: Negative.   Skin: Negative.   Neurological: Negative.   Endo/Heme/Allergies: Negative.   Psychiatric/Behavioral: Negative.       Physical Exam:  vitals were not taken for this visit.   Wt Readings from Last 3 Encounters:  11/25/17 161 lb (73 kg)  09/30/17 175 lb (79.4 kg)  09/10/17 174 lb 8 oz (79.2 kg)   Physical Exam Vitals signs reviewed.  Constitutional:      Comments: Breast exam shows left breast with no masses, edema or erythema.  There is no left axillary adenopathy.  Right breast is slightly contracted from the surgery and radiation therapy.  She has a well  healed lumpectomy at the 3 o'clock position.  There is some slight firmness of the lumpectomy site.  There is no distinct mass.  She has a well-healed right axillary lymphadenectomy scar.  There is no right axillary adenopathy.  HENT:     Head: Normocephalic and atraumatic.  Eyes:     Pupils: Pupils are equal, round, and reactive to light.  Neck:     Musculoskeletal: Normal range of motion.  Cardiovascular:     Rate and Rhythm: Normal rate and regular rhythm.     Heart sounds: Normal heart sounds.  Pulmonary:     Effort: Pulmonary effort is normal.     Breath sounds: Normal breath sounds.  Abdominal:     General: Bowel sounds are normal.     Palpations: Abdomen is soft.   Musculoskeletal: Normal range of motion.        General: No tenderness or deformity.  Lymphadenopathy:     Cervical: No cervical adenopathy.  Skin:    General: Skin is warm and dry.     Findings: No erythema or rash.  Neurological:     Mental Status: She is alert and oriented to person, place, and time.  Psychiatric:        Behavior: Behavior normal.        Thought Content: Thought content normal.        Judgment: Judgment normal.     .  Lab Results  Component Value Date   WBC 6.3 01/08/2018   HGB 10.7 (L) 01/08/2018   HCT 34.0 (L) 01/08/2018   MCV 87.6 01/08/2018   PLT 240 01/08/2018   No results found for: FERRITIN, IRON, TIBC, UIBC, IRONPCTSAT Lab Results  Component Value Date   RBC 3.88 01/08/2018   No results found for: KPAFRELGTCHN, LAMBDASER, KAPLAMBRATIO No results found for: IGGSERUM, IGA, IGMSERUM No results found for: Odetta Pink, SPEI   Chemistry      Component Value Date/Time   NA 140 11/25/2017 1148   NA 144 01/09/2017 0927   K 3.6 11/25/2017 1148   K 3.4 01/09/2017 0927   CL 109 (H) 11/25/2017 1148   CL 105 01/09/2017 0927   CO2 26 11/25/2017 1148   CO2 29 01/09/2017 0927   BUN 11 11/25/2017 1148   BUN 7 01/09/2017 0927   CREATININE 0.90 11/25/2017 1148   CREATININE 0.9 01/09/2017 0927      Component Value Date/Time   CALCIUM 8.9 11/25/2017 1148   CALCIUM 9.1 01/09/2017 0927   ALKPHOS 40 11/25/2017 1148   ALKPHOS 68 01/09/2017 0927   AST 24 11/25/2017 1148   ALT 24 11/25/2017 1148   ALT 26 01/09/2017 0927   BILITOT 0.5 11/25/2017 1148      Impression and Plan: Ms. Busler is a very pleasant 52 yo postmenopausal caucasian female with stage IIIa invasive ductal carcinoma of the right breast with lumpectomy.   So far, everything has gone quite well for her.  She will continue the Nerlynx.  She will finish this up in September 2020.  I think we can move her out now to 3 months.  She  will have her Prolia when she comes back.  She still has her Port-A-Cath in.  We will make sure that we get this flushed in 6 weeks.       [name], MD 12/18/20193:23 PM

## 2018-01-17 ENCOUNTER — Other Ambulatory Visit: Payer: Self-pay | Admitting: Hematology & Oncology

## 2018-01-29 ENCOUNTER — Other Ambulatory Visit: Payer: Self-pay | Admitting: General Surgery

## 2018-01-29 DIAGNOSIS — Z853 Personal history of malignant neoplasm of breast: Secondary | ICD-10-CM

## 2018-03-23 HISTORY — PX: BREAST BIOPSY: SHX20

## 2018-04-02 ENCOUNTER — Inpatient Hospital Stay: Payer: BC Managed Care – PPO

## 2018-04-02 ENCOUNTER — Inpatient Hospital Stay: Payer: BC Managed Care – PPO | Attending: Hematology & Oncology | Admitting: Hematology & Oncology

## 2018-04-02 ENCOUNTER — Encounter: Payer: Self-pay | Admitting: Hematology & Oncology

## 2018-04-02 ENCOUNTER — Other Ambulatory Visit: Payer: Self-pay

## 2018-04-02 VITALS — BP 157/77 | HR 86 | Temp 98.7°F | Resp 18

## 2018-04-02 VITALS — Ht 64.0 in | Wt 172.0 lb

## 2018-04-02 DIAGNOSIS — Z79811 Long term (current) use of aromatase inhibitors: Secondary | ICD-10-CM | POA: Insufficient documentation

## 2018-04-02 DIAGNOSIS — Z170 Estrogen receptor positive status [ER+]: Secondary | ICD-10-CM

## 2018-04-02 DIAGNOSIS — Z95828 Presence of other vascular implants and grafts: Secondary | ICD-10-CM

## 2018-04-02 DIAGNOSIS — C50911 Malignant neoplasm of unspecified site of right female breast: Secondary | ICD-10-CM

## 2018-04-02 DIAGNOSIS — C773 Secondary and unspecified malignant neoplasm of axilla and upper limb lymph nodes: Secondary | ICD-10-CM

## 2018-04-02 DIAGNOSIS — C50211 Malignant neoplasm of upper-inner quadrant of right female breast: Secondary | ICD-10-CM

## 2018-04-02 LAB — CBC WITH DIFFERENTIAL (CANCER CENTER ONLY)
Abs Immature Granulocytes: 0.02 10*3/uL (ref 0.00–0.07)
Basophils Absolute: 0 10*3/uL (ref 0.0–0.1)
Basophils Relative: 1 %
Eosinophils Absolute: 0.2 10*3/uL (ref 0.0–0.5)
Eosinophils Relative: 3 %
HEMATOCRIT: 31 % — AB (ref 36.0–46.0)
Hemoglobin: 10.1 g/dL — ABNORMAL LOW (ref 12.0–15.0)
Immature Granulocytes: 0 %
Lymphocytes Relative: 34 %
Lymphs Abs: 1.8 10*3/uL (ref 0.7–4.0)
MCH: 29.2 pg (ref 26.0–34.0)
MCHC: 32.6 g/dL (ref 30.0–36.0)
MCV: 89.6 fL (ref 80.0–100.0)
Monocytes Absolute: 0.3 10*3/uL (ref 0.1–1.0)
Monocytes Relative: 6 %
Neutro Abs: 3 10*3/uL (ref 1.7–7.7)
Neutrophils Relative %: 56 %
Platelet Count: 230 10*3/uL (ref 150–400)
RBC: 3.46 MIL/uL — ABNORMAL LOW (ref 3.87–5.11)
RDW: 15.8 % — ABNORMAL HIGH (ref 11.5–15.5)
WBC Count: 5.3 10*3/uL (ref 4.0–10.5)
nRBC: 0 % (ref 0.0–0.2)

## 2018-04-02 LAB — CMP (CANCER CENTER ONLY)
ALT: 16 U/L (ref 0–44)
AST: 16 U/L (ref 15–41)
Albumin: 4.1 g/dL (ref 3.5–5.0)
Alkaline Phosphatase: 60 U/L (ref 38–126)
Anion gap: 7 (ref 5–15)
BILIRUBIN TOTAL: 0.2 mg/dL — AB (ref 0.3–1.2)
BUN: 18 mg/dL (ref 6–20)
CO2: 29 mmol/L (ref 22–32)
Calcium: 9 mg/dL (ref 8.9–10.3)
Chloride: 103 mmol/L (ref 98–111)
Creatinine: 0.75 mg/dL (ref 0.44–1.00)
GFR, Est AFR Am: >60 mL/min (ref >60)
GFR, Estimated: >60 mL/min (ref >60)
Glucose, Bld: 118 mg/dL — ABNORMAL HIGH (ref 70–99)
Potassium: 3.7 mmol/L (ref 3.5–5.1)
Sodium: 139 mmol/L (ref 135–145)
Total Protein: 6.5 g/dL (ref 6.5–8.1)

## 2018-04-02 MED ORDER — SODIUM CHLORIDE 0.9% FLUSH
10.0000 mL | INTRAVENOUS | Status: DC | PRN
  Administered 2018-04-02: 10 mL via INTRAVENOUS
  Filled 2018-04-02: qty 10

## 2018-04-02 MED ORDER — DENOSUMAB 60 MG/ML ~~LOC~~ SOSY
60.0000 mg | PREFILLED_SYRINGE | Freq: Once | SUBCUTANEOUS | Status: AC
  Administered 2018-04-02: 60 mg via SUBCUTANEOUS

## 2018-04-02 MED ORDER — SODIUM CHLORIDE 0.9 % IV SOLN
Freq: Once | INTRAVENOUS | Status: DC
  Filled 2018-04-02: qty 250

## 2018-04-02 MED ORDER — HEPARIN SOD (PORK) LOCK FLUSH 100 UNIT/ML IV SOLN
500.0000 [IU] | Freq: Once | INTRAVENOUS | Status: AC
  Administered 2018-04-02: 500 [IU] via INTRAVENOUS
  Filled 2018-04-02: qty 5

## 2018-04-02 NOTE — Patient Instructions (Signed)

## 2018-04-02 NOTE — Progress Notes (Signed)
Hematology and Oncology Follow Up Visit  Christina Joseph [id number] 1965/11/05 53 y.o. 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  She is doing pretty well.  She really has no specific complaints.  She is taking Imodium as the Nerlynx continues to cause problems for her with diarrhea.  However, she is determined to finish the year of Nerlynx.  She had a wonderful Christmas and New Year's.  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  She has had no problems with cough or shortness of breath.  She has had no nausea or vomiting.  She has had no issues with her urine.  There is no leg swelling.  There is been no rashes.  She has had no headaches.  Overall, her performance status is ECOG 1.    Medications:  Allergies as of 04/02/2018   No Known Allergies     Medication List       Accurate as of April 02, 2018  3:11 PM. Always use your most recent med list.        amoxicillin 500 MG tablet Commonly known as:  AMOXIL Take 2067m once, one hour prior to the dental procedure.   letrozole 2.5 MG tablet Commonly known as:  FEMARA TAKE 1 TABLET(2.5 MG) BY MOUTH DAILY   loperamide 2 MG capsule Commonly known as:  IMODIUM TAKE 2 CAPSULES BY MOUTH THREE TIMES DAILY FOR 2 WEEKS THEN TAKE 2 CAPSULES BY MOUTH TWICE DAILY FOR 6 WEEKS AS NEEDED   Neratinib Maleate 40 MG tablet Commonly known as:  Nerlynx Take 6 tablets (240 mg total) by mouth daily. Take with food.   valACYclovir 1000 MG tablet  Commonly known as:  VALTREX Take 1 tablet (1,000 mg total) by mouth daily as needed.   Vitamin D (Ergocalciferol) 1.25 MG (50000 UT) Caps capsule Commonly known as:  DRISDOL TAKE 1 CAPSULE BY MOUTH 1 TIME A WEEK       Allergies: No Known Allergies  Past Medical History, Surgical history, Social history, and Family History were reviewed and updated.  Review of Systems: Review of Systems  Constitutional: Negative.   HENT: Negative.   Eyes: Negative.   Respiratory: Negative.   Cardiovascular: Negative.  Palpitations: .phys.  Gastrointestinal: Negative.   Genitourinary: Negative.   Musculoskeletal: Negative.   Skin: Negative.   Neurological: Negative.   Endo/Heme/Allergies: Negative.   Psychiatric/Behavioral: Negative.       Physical Exam:  height is _0  (1.626 m) and weight is 172 lb (78 kg).   Wt Readings from Last 3 Encounters:  04/02/18 172 lb (78 kg)  01/08/18 161 lb 1.9 oz (73.1 kg)  11/25/17 161 lb (73 kg)   Physical Exam Vitals signs reviewed.  Constitutional:      Comments: Breast exam shows left breast with no masses, edema or erythema.  There is no left axillary adenopathy.  Right breast is slightly contracted from the surgery and radiation therapy.  She has a well healed lumpectomy at the 3  o'clock position.  There is some slight firmness of the lumpectomy site.  There is no distinct mass.  She has a well-healed right axillary lymphadenectomy scar.  There is no right axillary adenopathy.  HENT:     Head: Normocephalic and atraumatic.  Eyes:     Pupils: Pupils are equal, round, and reactive to light.  Neck:     Musculoskeletal: Normal range of motion.  Cardiovascular:     Rate and Rhythm: Normal rate and regular rhythm.     Heart sounds: Normal heart sounds.  Pulmonary:     Effort: Pulmonary effort is normal.     Breath sounds: Normal breath sounds.  Abdominal:     General: Bowel sounds are normal.     Palpations: Abdomen is soft.  Musculoskeletal:  Normal range of motion.        General: No tenderness or deformity.  Lymphadenopathy:     Cervical: No cervical adenopathy.  Skin:    General: Skin is warm and dry.     Findings: No erythema or rash.  Neurological:     Mental Status: She is alert and oriented to person, place, and time.  Psychiatric:        Behavior: Behavior normal.        Thought Content: Thought content normal.        Judgment: Judgment normal.     .  Lab Results  Component Value Date   WBC 6.3 01/08/2018   HGB 10.7 (L) 01/08/2018   HCT 34.0 (L) 01/08/2018   MCV 87.6 01/08/2018   PLT 240 01/08/2018   No results found for: FERRITIN, IRON, TIBC, UIBC, IRONPCTSAT Lab Results  Component Value Date   RBC 3.88 01/08/2018   No results found for: KPAFRELGTCHN, LAMBDASER, KAPLAMBRATIO No results found for: IGGSERUM, IGA, IGMSERUM No results found for: Odetta Pink, SPEI   Chemistry      Component Value Date/Time   NA 139 01/08/2018 1455   NA 144 01/09/2017 0927   K 3.8 01/08/2018 1455   K 3.4 01/09/2017 0927   CL 105 01/08/2018 1455   CL 105 01/09/2017 0927   CO2 27 01/08/2018 1455   CO2 29 01/09/2017 0927   BUN 17 01/08/2018 1455   BUN 7 01/09/2017 0927   CREATININE 0.80 01/08/2018 1455   CREATININE 0.9 01/09/2017 0927      Component Value Date/Time   CALCIUM 9.3 01/08/2018 1455   CALCIUM 9.1 01/09/2017 0927   ALKPHOS 51 01/08/2018 1455   ALKPHOS 68 01/09/2017 0927   AST 17 01/08/2018 1455   ALT 16 01/08/2018 1455   ALT 26 01/09/2017 0927   BILITOT 0.3 01/08/2018 1455      Impression and Plan: Christina Joseph is a very pleasant 53 yo postmenopausal caucasian female with stage IIIa invasive ductal carcinoma of the right breast with lumpectomy.   So far, everything has gone quite well for her.  She will continue the Nerlynx.  She will finish this up in September 2020.  She will get her Prolia today.  We will have her come back in 3 months  for her next visit.  She still has her Port-A-Cath in.  We will have her come back in 6 weeks for a Port-A-Cath flush.[name], MD 3/11/20203:11 PM

## 2018-04-04 ENCOUNTER — Other Ambulatory Visit: Payer: Self-pay | Admitting: General Surgery

## 2018-04-04 ENCOUNTER — Ambulatory Visit
Admission: RE | Admit: 2018-04-04 | Discharge: 2018-04-04 | Disposition: A | Discharge status: Home / Self Care | Payer: BC Managed Care – PPO | Source: Ambulatory Visit | Attending: General Surgery | Admitting: General Surgery

## 2018-04-04 ENCOUNTER — Other Ambulatory Visit: Payer: Self-pay

## 2018-04-04 DIAGNOSIS — R928 Other abnormal and inconclusive findings on diagnostic imaging of breast: Secondary | ICD-10-CM

## 2018-04-04 DIAGNOSIS — Z853 Personal history of malignant neoplasm of breast: Secondary | ICD-10-CM

## 2018-04-04 HISTORY — DX: Personal history of antineoplastic chemotherapy: Z92.21

## 2018-04-04 HISTORY — DX: Personal history of irradiation: Z92.3

## 2018-04-07 ENCOUNTER — Other Ambulatory Visit: Payer: Self-pay | Admitting: General Surgery

## 2018-04-07 DIAGNOSIS — N6310 Unspecified lump in the right breast, unspecified quadrant: Secondary | ICD-10-CM

## 2018-04-07 DIAGNOSIS — Z9889 Other specified postprocedural states: Secondary | ICD-10-CM

## 2018-04-09 ENCOUNTER — Telehealth: Payer: Self-pay | Admitting: *Deleted

## 2018-04-09 NOTE — Telephone Encounter (Signed)
Call received from patient concerned about letters she received on MyChart from [place] giving her two different results of her Mammogram/US.  Pt instructed to contact [facility] and also Dr. Darrel Hoover office regarding biopsy orders.  Pt appreciative of assistance and will contact both [facility] and Dr. Darrel Hoover office.

## 2018-04-11 ENCOUNTER — Other Ambulatory Visit: Payer: Self-pay

## 2018-04-11 ENCOUNTER — Ambulatory Visit
Admission: RE | Admit: 2018-04-11 | Discharge: 2018-04-11 | Disposition: A | Discharge status: Home / Self Care | Payer: BC Managed Care – PPO | Source: Ambulatory Visit | Attending: General Surgery | Admitting: General Surgery

## 2018-04-11 DIAGNOSIS — Z9889 Other specified postprocedural states: Secondary | ICD-10-CM

## 2018-04-11 DIAGNOSIS — N6310 Unspecified lump in the right breast, unspecified quadrant: Secondary | ICD-10-CM

## 2018-06-24 ENCOUNTER — Other Ambulatory Visit: Payer: Self-pay | Admitting: Family

## 2018-07-03 ENCOUNTER — Inpatient Hospital Stay: Payer: BC Managed Care – PPO | Attending: Hematology & Oncology | Admitting: Hematology & Oncology

## 2018-07-03 ENCOUNTER — Other Ambulatory Visit: Payer: Self-pay

## 2018-07-03 ENCOUNTER — Inpatient Hospital Stay: Payer: BC Managed Care – PPO

## 2018-07-03 ENCOUNTER — Ambulatory Visit: Payer: BC Managed Care – PPO

## 2018-07-03 ENCOUNTER — Encounter: Payer: Self-pay | Admitting: Hematology & Oncology

## 2018-07-03 VITALS — BP 129/84 | HR 71 | Temp 98.5°F | Resp 16 | Wt 173.0 lb

## 2018-07-03 DIAGNOSIS — Z170 Estrogen receptor positive status [ER+]: Secondary | ICD-10-CM | POA: Diagnosis not present

## 2018-07-03 DIAGNOSIS — C50911 Malignant neoplasm of unspecified site of right female breast: Secondary | ICD-10-CM

## 2018-07-03 DIAGNOSIS — Z95828 Presence of other vascular implants and grafts: Secondary | ICD-10-CM | POA: Diagnosis not present

## 2018-07-03 LAB — CMP (CANCER CENTER ONLY)
ALT: 23 U/L (ref 0–44)
AST: 22 U/L (ref 15–41)
Albumin: 4.4 g/dL (ref 3.5–5.0)
Alkaline Phosphatase: 44 U/L (ref 38–126)
Anion gap: 8 (ref 5–15)
BUN: 14 mg/dL (ref 6–20)
CO2: 27 mmol/L (ref 22–32)
Calcium: 9.5 mg/dL (ref 8.9–10.3)
Chloride: 104 mmol/L (ref 98–111)
Creatinine: 0.76 mg/dL (ref 0.44–1.00)
GFR, Est AFR Am: >60 mL/min (ref >60)
GFR, Estimated: >60 mL/min (ref >60)
Glucose, Bld: 83 mg/dL (ref 70–99)
Potassium: 4.1 mmol/L (ref 3.5–5.1)
Sodium: 139 mmol/L (ref 135–145)
Total Bilirubin: 0.4 mg/dL (ref 0.3–1.2)
Total Protein: 6.9 g/dL (ref 6.5–8.1)

## 2018-07-03 LAB — CBC WITH DIFFERENTIAL (CANCER CENTER ONLY)
Abs Immature Granulocytes: 0.01 10*3/uL (ref 0.00–0.07)
Basophils Absolute: 0 10*3/uL (ref 0.0–0.1)
Basophils Relative: 1 %
Eosinophils Absolute: 0.1 10*3/uL (ref 0.0–0.5)
Eosinophils Relative: 2 %
HCT: 34.4 % — ABNORMAL LOW (ref 36.0–46.0)
Hemoglobin: 11.1 g/dL — ABNORMAL LOW (ref 12.0–15.0)
Immature Granulocytes: 0 %
Lymphocytes Relative: 33 %
Lymphs Abs: 1.9 10*3/uL (ref 0.7–4.0)
MCH: 28.5 pg (ref 26.0–34.0)
MCHC: 32.3 g/dL (ref 30.0–36.0)
MCV: 88.4 fL (ref 80.0–100.0)
Monocytes Absolute: 0.4 10*3/uL (ref 0.1–1.0)
Monocytes Relative: 7 %
Neutro Abs: 3.5 10*3/uL (ref 1.7–7.7)
Neutrophils Relative %: 57 %
Platelet Count: 225 10*3/uL (ref 150–400)
RBC: 3.89 MIL/uL (ref 3.87–5.11)
RDW: 13.4 % (ref 11.5–15.5)
WBC Count: 6 10*3/uL (ref 4.0–10.5)
nRBC: 0 % (ref 0.0–0.2)

## 2018-07-03 MED ORDER — SODIUM CHLORIDE 0.9% FLUSH
10.0000 mL | Freq: Once | INTRAVENOUS | Status: AC
  Administered 2018-07-03: 14:00:00 10 mL via INTRAVENOUS
  Filled 2018-07-03: qty 10

## 2018-07-03 MED ORDER — HEPARIN SOD (PORK) LOCK FLUSH 100 UNIT/ML IV SOLN
500.0000 [IU] | Freq: Once | INTRAVENOUS | Status: AC
  Administered 2018-07-03: 500 [IU] via INTRAVENOUS
  Filled 2018-07-03: qty 5

## 2018-07-03 NOTE — Progress Notes (Signed)
Hematology and Oncology Follow Up Visit  Christina Joseph [id number] 1965-08-12 53 y.o. 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  She is doing pretty well.  She is managing the coronavirus pretty well.  She was at school.  She works at [employer].  She was out of school for quite a while.  Thankfully, her husband did not lose his job.  She still is having issues with the Nerlynx.  She is still having diarrhea.  Imodium is only thing that helps with this.  She will stop the Nerlynx in September.  She would have been on it for 1 year.  She does try to exercise.  She does walk her dog.  She is a little sure all that she walks.  She has had no problems with cough or shortness of breath.  She actually had a biopsy of a breast lesion back in March.  Thankfully, the pathology report ([id number]) showed a benign breast lesion.  There is some fibrosis.  There is certainly got her all anxious and nervous.  Currently, her performance status is ECOG 0.    Medications:  Allergies as of 07/03/2018   No Known Allergies     Medication List       Accurate as of July 03, 2018  2:10 PM. If you have any questions, ask your nurse or doctor.        STOP taking these medications   Vitamin D (Ergocalciferol) 1.25 MG (50000 UT) Caps capsule Commonly known as: DRISDOL Stopped by: [name], MD     TAKE these medications   amoxicillin 500 MG tablet Commonly known as: AMOXIL Take '2000mg'$  once, one hour prior  to the dental procedure.   letrozole 2.5 MG tablet Commonly known as: FEMARA TAKE 1 TABLET(2.5 MG) BY MOUTH DAILY   loperamide 2 MG capsule Commonly known as: IMODIUM TAKE 2 CAPSULES BY MOUTH THREE TIMES DAILY FOR 2 WEEKS THEN TAKE 2 CAPSULES BY MOUTH TWICE DAILY FOR 6 WEEKS AS NEEDED   Neratinib Maleate 40 MG tablet Commonly known as: Nerlynx Take 6 tablets (240 mg total) by mouth daily. Take with food.   valACYclovir 1000 MG tablet Commonly known as: VALTREX Take 1 tablet (1,000 mg total) by mouth daily as needed.       Allergies: No Known Allergies  Past Medical History, Surgical history, Social history, and Family History were reviewed and updated.  Review of Systems: Review of Systems  Constitutional: Negative.   HENT: Negative.   Eyes: Negative.   Respiratory: Negative.   Cardiovascular: Negative.  Palpitations: .phys.  Gastrointestinal: Negative.   Genitourinary: Negative.   Musculoskeletal: Negative.   Skin: Negative.   Neurological: Negative.   Endo/Heme/Allergies: Negative.   Psychiatric/Behavioral: Negative.       Physical Exam:  weight is 173 lb (78.5 kg). Her oral temperature is 98.5 F (36.9 C). Her blood pressure is 129/84 and her pulse is 71. Her respiration is 16 and oxygen saturation is 98%.   Wt  Readings from Last 3 Encounters:  07/03/18 173 lb (78.5 kg)  04/02/18 172 lb (78 kg)  01/08/18 161 lb 1.9 oz (73.1 kg)   Physical Exam Vitals signs reviewed.  Constitutional:      Comments: Breast exam shows left breast with no masses, edema or erythema.  There is no left axillary adenopathy.  Right breast is slightly contracted from the surgery and radiation therapy.  She has a well healed lumpectomy at the 3 o'clock position.  There is some slight firmness of the lumpectomy site.  There is no distinct mass.  She has a well-healed right axillary lymphadenectomy scar.  There is no right axillary adenopathy.  HENT:     Head: Normocephalic and  atraumatic.  Eyes:     Pupils: Pupils are equal, round, and reactive to light.  Neck:     Musculoskeletal: Normal range of motion.  Cardiovascular:     Rate and Rhythm: Normal rate and regular rhythm.     Heart sounds: Normal heart sounds.  Pulmonary:     Effort: Pulmonary effort is normal.     Breath sounds: Normal breath sounds.  Abdominal:     General: Bowel sounds are normal.     Palpations: Abdomen is soft.  Musculoskeletal: Normal range of motion.        General: No tenderness or deformity.  Lymphadenopathy:     Cervical: No cervical adenopathy.  Skin:    General: Skin is warm and dry.     Findings: No erythema or rash.  Neurological:     Mental Status: She is alert and oriented to person, place, and time.  Psychiatric:        Behavior: Behavior normal.        Thought Content: Thought content normal.        Judgment: Judgment normal.     .  Lab Results  Component Value Date   WBC 6.0 07/03/2018   HGB 11.1 (L) 07/03/2018   HCT 34.4 (L) 07/03/2018   MCV 88.4 07/03/2018   PLT 225 07/03/2018   No results found for: FERRITIN, IRON, TIBC, UIBC, IRONPCTSAT Lab Results  Component Value Date   RBC 3.89 07/03/2018   No results found for: KPAFRELGTCHN, LAMBDASER, KAPLAMBRATIO No results found for: IGGSERUM, IGA, IGMSERUM No results found for: Odetta Pink, SPEI   Chemistry      Component Value Date/Time   NA 139 07/03/2018 1330   NA 144 01/09/2017 0927   K 4.1 07/03/2018 1330   K 3.4 01/09/2017 0927   CL 104 07/03/2018 1330   CL 105 01/09/2017 0927   CO2 27 07/03/2018 1330   CO2 29 01/09/2017 0927   BUN 14 07/03/2018 1330   BUN 7 01/09/2017 0927   CREATININE 0.76 07/03/2018 1330   CREATININE 0.9 01/09/2017 0927      Component Value Date/Time   CALCIUM 9.5 07/03/2018 1330   CALCIUM 9.1 01/09/2017 0927   ALKPHOS 44 07/03/2018 1330   ALKPHOS 68 01/09/2017 0927   AST 22 07/03/2018 1330   ALT 23  07/03/2018 1330   ALT 26 01/09/2017 0927   BILITOT 0.4 07/03/2018 1330      Christina Joseph is a very pleasant 53 yo postmenopausal caucasian female with stage IIIa invasive ductal carcinoma of the right breast with lumpectomy.   So far, everything has gone quite well for her.  She will continue the Nerlynx.  She will finish this up in September 2020.  Again, I am very happy that this breast lesion on the right breast was not malignant.  We will have her come back in 3 months for her next visit. At that time, we will be able to stop her Nerlynx.  She is very excited about this happening.  She still has her Port-A-Cath in.  We will have her come back in 6 weeks for a Port-A-Cath flush.[name], MD 6/11/20202:10 PM

## 2018-07-03 NOTE — Patient Instructions (Signed)

## 2018-07-04 ENCOUNTER — Telehealth: Payer: Self-pay | Admitting: Hematology & Oncology

## 2018-07-04 NOTE — Telephone Encounter (Signed)
lmom for appts in July and September per 6/11 los

## 2018-08-14 ENCOUNTER — Other Ambulatory Visit: Payer: Self-pay

## 2018-08-14 ENCOUNTER — Inpatient Hospital Stay: Payer: BC Managed Care – PPO | Attending: Hematology & Oncology

## 2018-08-14 DIAGNOSIS — Z452 Encounter for adjustment and management of vascular access device: Secondary | ICD-10-CM | POA: Insufficient documentation

## 2018-08-14 DIAGNOSIS — C50911 Malignant neoplasm of unspecified site of right female breast: Secondary | ICD-10-CM | POA: Insufficient documentation

## 2018-08-14 DIAGNOSIS — C773 Secondary and unspecified malignant neoplasm of axilla and upper limb lymph nodes: Secondary | ICD-10-CM | POA: Diagnosis not present

## 2018-08-27 ENCOUNTER — Other Ambulatory Visit: Payer: Self-pay | Admitting: Hematology & Oncology

## 2018-08-27 DIAGNOSIS — C50211 Malignant neoplasm of upper-inner quadrant of right female breast: Secondary | ICD-10-CM

## 2018-10-03 ENCOUNTER — Other Ambulatory Visit: Payer: BC Managed Care – PPO

## 2018-10-03 ENCOUNTER — Ambulatory Visit: Payer: BC Managed Care – PPO | Admitting: Hematology & Oncology

## 2018-10-03 ENCOUNTER — Ambulatory Visit: Payer: BC Managed Care – PPO

## 2018-10-15 ENCOUNTER — Inpatient Hospital Stay: Payer: BC Managed Care – PPO | Attending: Hematology & Oncology

## 2018-10-15 ENCOUNTER — Inpatient Hospital Stay: Payer: BC Managed Care – PPO

## 2018-10-15 ENCOUNTER — Encounter: Payer: Self-pay | Admitting: Hematology & Oncology

## 2018-10-15 ENCOUNTER — Other Ambulatory Visit: Payer: Self-pay

## 2018-10-15 ENCOUNTER — Inpatient Hospital Stay (HOSPITAL_BASED_OUTPATIENT_CLINIC_OR_DEPARTMENT_OTHER): Payer: BC Managed Care – PPO | Admitting: Hematology & Oncology

## 2018-10-15 VITALS — BP 139/75 | HR 75 | Temp 97.7°F | Resp 18 | Wt 152.0 lb

## 2018-10-15 DIAGNOSIS — C50911 Malignant neoplasm of unspecified site of right female breast: Secondary | ICD-10-CM | POA: Insufficient documentation

## 2018-10-15 DIAGNOSIS — Z170 Estrogen receptor positive status [ER+]: Secondary | ICD-10-CM

## 2018-10-15 DIAGNOSIS — C50211 Malignant neoplasm of upper-inner quadrant of right female breast: Secondary | ICD-10-CM

## 2018-10-15 DIAGNOSIS — C773 Secondary and unspecified malignant neoplasm of axilla and upper limb lymph nodes: Secondary | ICD-10-CM | POA: Diagnosis not present

## 2018-10-15 DIAGNOSIS — Z79811 Long term (current) use of aromatase inhibitors: Secondary | ICD-10-CM | POA: Insufficient documentation

## 2018-10-15 DIAGNOSIS — Z95828 Presence of other vascular implants and grafts: Secondary | ICD-10-CM

## 2018-10-15 LAB — CBC WITH DIFFERENTIAL (CANCER CENTER ONLY)
Abs Immature Granulocytes: 0.01 10*3/uL (ref 0.00–0.07)
Basophils Absolute: 0 10*3/uL (ref 0.0–0.1)
Basophils Relative: 1 %
Eosinophils Absolute: 0.1 10*3/uL (ref 0.0–0.5)
Eosinophils Relative: 1 %
HCT: 33.5 % — ABNORMAL LOW (ref 36.0–46.0)
Hemoglobin: 10.8 g/dL — ABNORMAL LOW (ref 12.0–15.0)
Immature Granulocytes: 0 %
Lymphocytes Relative: 25 %
Lymphs Abs: 1.3 10*3/uL (ref 0.7–4.0)
MCH: 29.7 pg (ref 26.0–34.0)
MCHC: 32.2 g/dL (ref 30.0–36.0)
MCV: 92 fL (ref 80.0–100.0)
Monocytes Absolute: 0.4 10*3/uL (ref 0.1–1.0)
Monocytes Relative: 7 %
Neutro Abs: 3.3 10*3/uL (ref 1.7–7.7)
Neutrophils Relative %: 66 %
Platelet Count: 187 10*3/uL (ref 150–400)
RBC: 3.64 MIL/uL — ABNORMAL LOW (ref 3.87–5.11)
RDW: 13.6 % (ref 11.5–15.5)
WBC Count: 5 10*3/uL (ref 4.0–10.5)
nRBC: 0 % (ref 0.0–0.2)

## 2018-10-15 LAB — CMP (CANCER CENTER ONLY)
ALT: 15 U/L (ref 0–44)
AST: 17 U/L (ref 15–41)
Albumin: 4.4 g/dL (ref 3.5–5.0)
Alkaline Phosphatase: 52 U/L (ref 38–126)
Anion gap: 7 (ref 5–15)
BUN: 13 mg/dL (ref 6–20)
CO2: 28 mmol/L (ref 22–32)
Calcium: 9.4 mg/dL (ref 8.9–10.3)
Chloride: 107 mmol/L (ref 98–111)
Creatinine: 0.69 mg/dL (ref 0.44–1.00)
GFR, Est AFR Am: >60 mL/min (ref >60)
GFR, Estimated: >60 mL/min (ref >60)
Glucose, Bld: 83 mg/dL (ref 70–99)
Potassium: 3.9 mmol/L (ref 3.5–5.1)
Sodium: 142 mmol/L (ref 135–145)
Total Bilirubin: 0.4 mg/dL (ref 0.3–1.2)
Total Protein: 7 g/dL (ref 6.5–8.1)

## 2018-10-15 MED ORDER — DENOSUMAB 60 MG/ML ~~LOC~~ SOSY
PREFILLED_SYRINGE | SUBCUTANEOUS | Status: AC
  Filled 2018-10-15: qty 1

## 2018-10-15 MED ORDER — HEPARIN SOD (PORK) LOCK FLUSH 100 UNIT/ML IV SOLN
500.0000 [IU] | Freq: Once | INTRAVENOUS | Status: AC
  Administered 2018-10-15: 500 [IU] via INTRAVENOUS
  Filled 2018-10-15: qty 5

## 2018-10-15 MED ORDER — SODIUM CHLORIDE 0.9% FLUSH
10.0000 mL | Freq: Once | INTRAVENOUS | Status: AC
  Administered 2018-10-15: 10 mL
  Filled 2018-10-15: qty 10

## 2018-10-15 MED ORDER — DENOSUMAB 60 MG/ML ~~LOC~~ SOSY
60.0000 mg | PREFILLED_SYRINGE | Freq: Once | SUBCUTANEOUS | Status: AC
  Administered 2018-10-15: 60 mg via SUBCUTANEOUS

## 2018-10-15 NOTE — Patient Instructions (Signed)
Denosumab injection (Prolia) What is this medicine? DENOSUMAB (den oh sue mab) slows bone breakdown. Prolia is used to treat osteoporosis in women after menopause and in men, and in people who are taking corticosteroids for 6 months or more. Delton See is used to treat a high calcium level due to cancer and to prevent bone fractures and other bone problems caused by multiple myeloma or cancer bone metastases. Delton See is also used to treat giant cell tumor of the bone. This medicine may be used for other purposes; ask your health care provider or pharmacist if you have questions. COMMON BRAND NAME(S): Prolia, XGEVA What should I tell my health care provider before I take this medicine? They need to know if you have any of these conditions:  dental disease  having surgery or tooth extraction  infection  kidney disease  low levels of calcium or Vitamin D in the blood  malnutrition  on hemodialysis  skin conditions or sensitivity  thyroid or parathyroid disease  an unusual reaction to denosumab, other medicines, foods, dyes, or preservatives  pregnant or trying to get pregnant  breast-feeding How should I use this medicine? This medicine is for injection under the skin. It is given by a health care professional in a hospital or clinic setting. A special MedGuide will be given to you before each treatment. Be sure to read this information carefully each time. For Prolia, talk to your pediatrician regarding the use of this medicine in children. Special care may be needed. For Delton See, talk to your pediatrician regarding the use of this medicine in children. While this drug may be prescribed for children as young as 13 years for selected conditions, precautions do apply. Overdosage: If you think you have taken too much of this medicine contact a poison control center or emergency room at once. NOTE: This medicine is only for you. Do not share this medicine with others. What if I miss a dose? It  is important not to miss your dose. Call your doctor or health care professional if you are unable to keep an appointment. What may interact with this medicine? Do not take this medicine with any of the following medications:  other medicines containing denosumab This medicine may also interact with the following medications:  medicines that lower your chance of fighting infection  steroid medicines like prednisone or cortisone This list may not describe all possible interactions. Give your health care provider a list of all the medicines, herbs, non-prescription drugs, or dietary supplements you use. Also tell them if you smoke, drink alcohol, or use illegal drugs. Some items may interact with your medicine. What should I watch for while using this medicine? Visit your doctor or health care professional for regular checks on your progress. Your doctor or health care professional may order blood tests and other tests to see how you are doing. Call your doctor or health care professional for advice if you get a fever, chills or sore throat, or other symptoms of a cold or flu. Do not treat yourself. This drug may decrease your body's ability to fight infection. Try to avoid being around people who are sick. You should make sure you get enough calcium and vitamin D while you are taking this medicine, unless your doctor tells you not to. Discuss the foods you eat and the vitamins you take with your health care professional. See your dentist regularly. Brush and floss your teeth as directed. Before you have any dental work done, tell your dentist you  are receiving this medicine. Do not become pregnant while taking this medicine or for 5 months after stopping it. Talk with your doctor or health care professional about your birth control options while taking this medicine. Women should inform their doctor if they wish to become pregnant or think they might be pregnant. There is a potential for serious side  effects to an unborn child. Talk to your health care professional or pharmacist for more information. What side effects may I notice from receiving this medicine? Side effects that you should report to your doctor or health care professional as soon as possible:  allergic reactions like skin rash, itching or hives, swelling of the face, lips, or tongue  bone pain  breathing problems  dizziness  jaw pain, especially after dental work  redness, blistering, peeling of the skin  signs and symptoms of infection like fever or chills; cough; sore throat; pain or trouble passing urine  signs of low calcium like fast heartbeat, muscle cramps or muscle pain; pain, tingling, numbness in the hands or feet; seizures  unusual bleeding or bruising  unusually weak or tired Side effects that usually do not require medical attention (report to your doctor or health care professional if they continue or are bothersome):  constipation  diarrhea  headache  joint pain  loss of appetite  muscle pain  runny nose  tiredness  upset stomach This list may not describe all possible side effects. Call your doctor for medical advice about side effects. You may report side effects to FDA at 1-800-FDA-1088. Where should I keep my medicine? This medicine is only given in a clinic, doctor's office, or other health care setting and will not be stored at home. NOTE: This sheet is a summary. It may not cover all possible information. If you have questions about this medicine, talk to your doctor, pharmacist, or health care provider.  2020 Elsevier/Gold Standard (2017-05-17 16:10:44)

## 2018-10-15 NOTE — Progress Notes (Signed)
Hematology and Oncology Follow Up Visit  Christina Joseph [id number] 02-11-1965 53 y.o. 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  She has had a little bit of a rough summer.  We last saw her back in June.  A couple weeks after that, she began to have a lot of nausea and vomiting.  She ultimately had to go to the emergency room.  This was at [facility] find any labs or x-rays that were done.  She was given IV fluids.  She said that she had a CT scan of the head that was done.  This was unremarkable.  She said that she was sent home.  She did not want to be admitted.  She was given some Zofran.  It sounds like she may have had some kind of gastroenteritis.  I will know if this was food poisoning.  No one else in her family was sick.  She is lost 21 pounds.  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  I think she says that they are doing a split type of protocol with students coming in Monday and Tuesday and another sister is coming in Thursday and Friday.  She has had no problems with respect to diarrhea.  She is now off Nerlynx.  She really had a tough time with this.  She had diarrhea with this.  We were able to finally get her on the right dose and have her take some Imodium which really slowed down the diarrhea.  She has had no problems with cough.  There is been no swallowing issues.  She has had no  mouth sores.  There is been no rashes.  Overall, her performance status is ECOG 0.    Medications:  Allergies as of 10/15/2018   No Known Allergies     Medication List       Accurate as of October 15, 2018  1:31 PM. If you have any questions, ask your nurse or doctor.        amoxicillin 500 MG tablet Commonly known as: AMOXIL Take 2029m once, one hour prior to the dental procedure.   letrozole 2.5 MG tablet Commonly known as: FEMARA TAKE 1 TABLET(2.5 MG) BY MOUTH DAILY   loperamide 2 MG capsule Commonly known as: IMODIUM TAKE 2 CAPSULES BY MOUTH THREE TIMES DAILY FOR 2 WEEKS THEN TAKE 2 CAPSULES BY MOUTH TWICE DAILY FOR 6 WEEKS AS NEEDED   Neratinib Maleate 40 MG tablet Commonly known as: Nerlynx Take 6 tablets (240 mg total) by mouth daily. Take with food.   valACYclovir 1000 MG tablet Commonly known as: VALTREX TAKE 1 TABLET(1000 MG) BY MOUTH DAILY AS NEEDED       Allergies: No Known Allergies  Past Medical History, Surgical history, Social history, and Family History were reviewed and updated.  Review of Systems: Review of Systems  Constitutional: Negative.  HENT: Negative.   Eyes: Negative.   Respiratory: Negative.   Cardiovascular: Negative.  Palpitations: .phys.  Gastrointestinal: Negative.   Genitourinary: Negative.   Musculoskeletal: Negative.   Skin: Negative.   Neurological: Negative.   Endo/Heme/Allergies: Negative.   Psychiatric/Behavioral: Negative.       Physical Exam:  weight is 152 lb (68.9 kg). Her temporal temperature is 97.7 F (36.5 C). Her blood pressure is 139/75 and her pulse is 75. Her respiration is 18 and oxygen saturation is 100%.   Wt Readings from Last 3 Encounters:  10/15/18 152 lb (68.9 kg)  07/03/18 173 lb (78.5 kg)  04/02/18 172 lb (78 kg)   Physical Exam Vitals signs reviewed.  Constitutional:      Comments: Breast exam shows left breast with no masses, edema or erythema.  There is no left axillary  adenopathy.  Right breast is slightly contracted from the surgery and radiation therapy.  She has a well healed lumpectomy at the 3 o'clock position.  There is some slight firmness of the lumpectomy site.  There is no distinct mass.  She has a well-healed right axillary lymphadenectomy scar.  There is no right axillary adenopathy.  HENT:     Head: Normocephalic and atraumatic.  Eyes:     Pupils: Pupils are equal, round, and reactive to light.  Neck:     Musculoskeletal: Normal range of motion.  Cardiovascular:     Rate and Rhythm: Normal rate and regular rhythm.     Heart sounds: Normal heart sounds.  Pulmonary:     Effort: Pulmonary effort is normal.     Breath sounds: Normal breath sounds.  Abdominal:     General: Bowel sounds are normal.     Palpations: Abdomen is soft.  Musculoskeletal: Normal range of motion.        General: No tenderness or deformity.  Lymphadenopathy:     Cervical: No cervical adenopathy.  Skin:    General: Skin is warm and dry.     Findings: No erythema or rash.  Neurological:     Mental Status: She is alert and oriented to person, place, and time.  Psychiatric:        Behavior: Behavior normal.        Thought Content: Thought content normal.        Judgment: Judgment normal.     .  Lab Results  Component Value Date   WBC 5.0 10/15/2018   HGB 10.8 (L) 10/15/2018   HCT 33.5 (L) 10/15/2018   MCV 92.0 10/15/2018   PLT 187 10/15/2018   No results found for: FERRITIN, IRON, TIBC, UIBC, IRONPCTSAT Lab Results  Component Value Date   RBC 3.64 (L) 10/15/2018   No results found for: KPAFRELGTCHN, LAMBDASER, KAPLAMBRATIO No results found for: IGGSERUM, IGA, IGMSERUM No results found for: Odetta Pink, SPEI   Chemistry      Component Value Date/Time   NA 142 10/15/2018 1236   NA 144 01/09/2017 0927   K 3.9 10/15/2018 1236   K 3.4 01/09/2017 0927   CL 107 10/15/2018 1236   CL 105 01/09/2017  0927   CO2 28 10/15/2018 1236   CO2 29 01/09/2017 0927   BUN 13 10/15/2018 1236   BUN 7 01/09/2017 0927   CREATININE 0.69 10/15/2018 1236   CREATININE 0.9 01/09/2017 0927      Component Value Date/Time   CALCIUM 9.4 10/15/2018 1236   CALCIUM 9.1 01/09/2017 0927   ALKPHOS 52 10/15/2018  8937   DSKAJGO 11 01/09/2017 0927   AST 17 10/15/2018 1236   ALT 15 10/15/2018 1236   ALT 26 01/09/2017 0927   BILITOT 0.4 10/15/2018 1236      Impression and Plan: Ms. Jansma is a very pleasant 53 yo postmenopausal caucasian female with stage IIIa invasive ductal carcinoma of the right breast with lumpectomy.   I do not see any evidence of breast cancer recurrence.  I really think that her prognosis is going to be fairly good.  I think we can now get her back in about 4 months.  I think we get her through the holidays.  She still has her Port-A-Cath in place.  She will get her Prolia today.     [name], MD 9/23/20201:31 PM

## 2018-10-15 NOTE — Addendum Note (Signed)
Addended by: Perlie Gold on: 10/15/2018 12:43 PM   Modules accepted: Orders

## 2018-10-15 NOTE — Patient Instructions (Signed)

## 2018-10-16 ENCOUNTER — Telehealth: Payer: Self-pay | Admitting: Hematology & Oncology

## 2018-10-16 NOTE — Telephone Encounter (Signed)
Spoke with patient to confirm appts per 9/23 LOS

## 2018-11-14 ENCOUNTER — Other Ambulatory Visit: Payer: Self-pay | Admitting: Family Medicine

## 2018-11-14 DIAGNOSIS — R922 Inconclusive mammogram: Secondary | ICD-10-CM

## 2018-12-03 ENCOUNTER — Other Ambulatory Visit: Payer: Self-pay

## 2018-12-03 ENCOUNTER — Inpatient Hospital Stay: Payer: BC Managed Care – PPO | Attending: Hematology & Oncology

## 2018-12-03 VITALS — BP 134/78 | HR 66 | Temp 98.4°F | Resp 17

## 2018-12-03 DIAGNOSIS — C50211 Malignant neoplasm of upper-inner quadrant of right female breast: Secondary | ICD-10-CM

## 2018-12-03 DIAGNOSIS — Z170 Estrogen receptor positive status [ER+]: Secondary | ICD-10-CM | POA: Insufficient documentation

## 2018-12-03 DIAGNOSIS — Z452 Encounter for adjustment and management of vascular access device: Secondary | ICD-10-CM | POA: Diagnosis present

## 2018-12-03 DIAGNOSIS — C773 Secondary and unspecified malignant neoplasm of axilla and upper limb lymph nodes: Secondary | ICD-10-CM | POA: Insufficient documentation

## 2018-12-03 DIAGNOSIS — C50911 Malignant neoplasm of unspecified site of right female breast: Secondary | ICD-10-CM | POA: Diagnosis present

## 2018-12-03 MED ORDER — SODIUM CHLORIDE 0.9% FLUSH
10.0000 mL | INTRAVENOUS | Status: DC | PRN
  Filled 2018-12-03: qty 10

## 2018-12-03 MED ORDER — SODIUM CHLORIDE 0.9% FLUSH
10.0000 mL | INTRAVENOUS | Status: DC | PRN
  Administered 2018-12-03: 10 mL via INTRAVENOUS
  Filled 2018-12-03: qty 10

## 2018-12-03 MED ORDER — HEPARIN SOD (PORK) LOCK FLUSH 100 UNIT/ML IV SOLN
500.0000 [IU] | Freq: Once | INTRAVENOUS | Status: AC
  Administered 2018-12-03: 500 [IU] via INTRAVENOUS
  Filled 2018-12-03: qty 5

## 2018-12-03 MED ORDER — HEPARIN SOD (PORK) LOCK FLUSH 100 UNIT/ML IV SOLN
500.0000 [IU] | Freq: Once | INTRAVENOUS | Status: DC
  Filled 2018-12-03: qty 5

## 2018-12-03 NOTE — Patient Instructions (Signed)

## 2019-01-14 ENCOUNTER — Other Ambulatory Visit: Payer: Self-pay

## 2019-01-14 ENCOUNTER — Encounter: Payer: Self-pay | Admitting: Hematology & Oncology

## 2019-01-14 ENCOUNTER — Inpatient Hospital Stay: Payer: BC Managed Care – PPO | Attending: Hematology & Oncology | Admitting: Hematology & Oncology

## 2019-01-14 ENCOUNTER — Inpatient Hospital Stay: Payer: BC Managed Care – PPO

## 2019-01-14 ENCOUNTER — Other Ambulatory Visit: Payer: Self-pay | Admitting: General Surgery

## 2019-01-14 VITALS — BP 125/83 | HR 71 | Temp 97.5°F | Resp 20 | Wt 142.1 lb

## 2019-01-14 DIAGNOSIS — C50911 Malignant neoplasm of unspecified site of right female breast: Secondary | ICD-10-CM | POA: Insufficient documentation

## 2019-01-14 DIAGNOSIS — Z170 Estrogen receptor positive status [ER+]: Secondary | ICD-10-CM

## 2019-01-14 DIAGNOSIS — Z452 Encounter for adjustment and management of vascular access device: Secondary | ICD-10-CM | POA: Insufficient documentation

## 2019-01-14 DIAGNOSIS — Z95828 Presence of other vascular implants and grafts: Secondary | ICD-10-CM

## 2019-01-14 MED ORDER — HEPARIN SOD (PORK) LOCK FLUSH 100 UNIT/ML IV SOLN
500.0000 [IU] | Freq: Once | INTRAVENOUS | Status: DC
  Filled 2019-01-14: qty 5

## 2019-01-14 MED ORDER — ALTEPLASE 2 MG IJ SOLR
INTRAMUSCULAR | Status: AC
  Filled 2019-01-14: qty 2

## 2019-01-14 MED ORDER — STERILE WATER FOR INJECTION IJ SOLN
INTRAMUSCULAR | Status: AC
  Filled 2019-01-14: qty 10

## 2019-01-14 MED ORDER — SODIUM CHLORIDE 0.9% FLUSH
10.0000 mL | INTRAVENOUS | Status: DC | PRN
  Filled 2019-01-14: qty 10

## 2019-01-14 MED ORDER — ALTEPLASE 2 MG IJ SOLR
2.0000 mg | Freq: Once | INTRAMUSCULAR | Status: AC | PRN
  Administered 2019-01-14: 2 mg
  Filled 2019-01-14: qty 2

## 2019-01-14 NOTE — Progress Notes (Signed)
Hematology and Oncology Follow Up Visit  Christina Joseph [id number] 10-01-1965 53 y.o. 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  She is doing quite well.  Now that she has been off the Nerlynx for a while, she really feels better.  There is no diarrhea.  She stopped the Nerlynx back in September.  She is working.  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  She is enjoying this.  Her daughter graduated from [employer] this December.  She I think is going to go on for postgraduate studies.  I think she already has a job offer.  Christina Joseph has had no problems with fever.  There is no cough.  She has had no nausea or vomiting.  She has had no change in bowel or bladder habits.  She is wondering if she could get the Port-A-Cath taken out.  It is really not working for her right now.  We cannot get blood work out of it.  I do not see a problem with her having the Port-A-Cath removed.  We will let her surgeon know about this.  She has had a nice Thanksgiving.  She will have a nice and quiet Christmas.  Overall, I would say performance status is ECOG 0.   Medications:  Allergies as of 01/14/2019   No Known Allergies     Medication List       Accurate as of January 14, 2019  2:31 PM. If you have any questions, ask your nurse or doctor.        STOP taking these medications   Neratinib Maleate 40 MG tablet Commonly known as: Nerlynx Stopped by: [name], MD     TAKE these  medications   amoxicillin 500 MG tablet Commonly known as: AMOXIL Take '2000mg'$  once, one hour prior to the dental procedure.   letrozole 2.5 MG tablet Commonly known as: [place] 1 TABLET(2.5 MG) BY MOUTH DAILY   loperamide 2 MG capsule Commonly known as: IMODIUM TAKE 2 CAPSULES BY MOUTH THREE TIMES DAILY FOR 2 WEEKS THEN TAKE 2 CAPSULES BY MOUTH TWICE DAILY FOR 6 WEEKS AS NEEDED   valACYclovir 1000 MG tablet Commonly known as: VALTREX TAKE 1 TABLET(1000 MG) BY MOUTH DAILY AS NEEDED       Allergies: No Known Allergies  Past Medical History, Surgical history, Social history, and Family History were reviewed and updated.  Review of Systems: Review of Systems  Constitutional: Negative.   HENT: Negative.   Eyes: Negative.   Respiratory: Negative.   Cardiovascular: Negative.  Palpitations: .phys.  Gastrointestinal: Negative.   Genitourinary: Negative.   Musculoskeletal: Negative.   Skin: Negative.   Neurological: Negative.   Endo/Heme/Allergies: Negative.   Psychiatric/Behavioral: Negative.       Physical Exam:  weight is 142 lb 1.3 oz (64.4 kg). Her temporal temperature is 97.5 F (36.4 C) (abnormal). Her blood pressure is 125/83 and her pulse is 71. Her respiration is 20 and oxygen saturation is  100%.   Wt Readings from Last 3 Encounters:  01/14/19 142 lb 1.3 oz (64.4 kg)  10/15/18 152 lb (68.9 kg)  07/03/18 173 lb (78.5 kg)   Physical Exam Vitals reviewed.  Constitutional:      Comments: Breast exam shows left breast with no masses, edema or erythema.  There is no left axillary adenopathy.  Right breast is slightly contracted from the surgery and radiation therapy.  She has a well healed lumpectomy at the 3 o'clock position.  There is some slight firmness of the lumpectomy site.  There is no distinct mass.  She has a well-healed right axillary lymphadenectomy scar.  There is no right axillary adenopathy.  HENT:     Head: Normocephalic and atraumatic.  Eyes:      Pupils: Pupils are equal, round, and reactive to light.  Cardiovascular:     Rate and Rhythm: Normal rate and regular rhythm.     Heart sounds: Normal heart sounds.  Pulmonary:     Effort: Pulmonary effort is normal.     Breath sounds: Normal breath sounds.  Abdominal:     General: Bowel sounds are normal.     Palpations: Abdomen is soft.  Musculoskeletal:        General: No tenderness or deformity. Normal range of motion.     Cervical back: Normal range of motion.  Lymphadenopathy:     Cervical: No cervical adenopathy.  Skin:    General: Skin is warm and dry.     Findings: No erythema or rash.  Neurological:     Mental Status: She is alert and oriented to person, place, and time.  Psychiatric:        Behavior: Behavior normal.        Thought Content: Thought content normal.        Judgment: Judgment normal.     .  Lab Results  Component Value Date   WBC 5.0 10/15/2018   HGB 10.8 (L) 10/15/2018   HCT 33.5 (L) 10/15/2018   MCV 92.0 10/15/2018   PLT 187 10/15/2018   No results found for: FERRITIN, IRON, TIBC, UIBC, IRONPCTSAT Lab Results  Component Value Date   RBC 3.64 (L) 10/15/2018   No results found for: KPAFRELGTCHN, LAMBDASER, KAPLAMBRATIO No results found for: IGGSERUM, IGA, IGMSERUM No results found for: Odetta Pink, SPEI   Chemistry      Component Value Date/Time   NA 142 10/15/2018 1236   NA 144 01/09/2017 0927   K 3.9 10/15/2018 1236   K 3.4 01/09/2017 0927   CL 107 10/15/2018 1236   CL 105 01/09/2017 0927   CO2 28 10/15/2018 1236   CO2 29 01/09/2017 0927   BUN 13 10/15/2018 1236   BUN 7 01/09/2017 0927   CREATININE 0.69 10/15/2018 1236   CREATININE 0.9 01/09/2017 0927      Component Value Date/Time   CALCIUM 9.4 10/15/2018 1236   CALCIUM 9.1 01/09/2017 0927   ALKPHOS 52 10/15/2018 1236   ALKPHOS 68 01/09/2017 0927   AST 17 10/15/2018 1236   ALT 15 10/15/2018 1236   ALT 26 01/09/2017  0927   BILITOT 0.4 10/15/2018 1236      Impression and Plan: Christina Joseph is a very pleasant 53 yo postmenopausal caucasian female with stage IIIa invasive ductal carcinoma of the right breast with lumpectomy.   I do not see any evidence of breast cancer recurrence.  I really think that her prognosis is going to be  fairly good.  I think we can now get her back in about 4 months.  I think we get her through the winter.  Again, we will see about having the Port-A-Cath taken out.   She will get Prolia when she comes back to see [place].[name], MD 12/23/20202:31 PM

## 2019-01-14 NOTE — Progress Notes (Signed)
Port accessed and flushed with no blood return. Attempted to draw blood at 1420 and 1450 with no success.

## 2019-01-14 NOTE — Patient Instructions (Signed)

## 2019-01-15 ENCOUNTER — Telehealth: Payer: Self-pay | Admitting: Hematology & Oncology

## 2019-01-15 NOTE — H&P (Signed)
   Darel Hong Location: [facility] Patient #: [id number] DOB: Mar 30, 1965 Married / Language: English / Race: White Female      History of Present Illness      This is a 53 year old female who is scheduled for port a cath removal following chemotherapy, following definitive surgery for her right breast cancer, and radiation therapy Dr. Marin Olp and Dr. Sondra Come are involved in her care. Her PCP is Vanita Panda.      She was diagnosed with a locally advanced cancer of the right breast earlier this year. Receptor positive. Port was placed by a Psychologist, sport and exercise in [place]. She had neoadjuvant chemotherapy with one of the oncologist in [facility] and had a good response. She decided to have her surgery here after consultation with Dr. Marin Olp. Her response on the end of treatment MRI was good enough to allow breast conservation surgery.      On October 17, 2016 she underwent right breast lumpectomy with seed localization and complete axillary lymph node dissection by me.    There was a 1.9 cm residual invasive cancer. Margins were negative. 3 out of 10 lymph nodes were positive. This is actually a triple-positive breast cancer. I removed her drains on November 06, 2016. She has done physical therapy at home and says her shoulder feels fine without any disability whatsoever.      She completed radiation therapy and herceptin based chemotherapy.        She takes femara daily and prolia q 6 months. She completed a course of Nerlynx on 09/23/2018      Exam is good. Her performance status is very good. She has minimal co morbidities. Dr. Marin Olp has been following her closely and has referred her for port removal.   Allergies No Known Drug Allergies  Allergies Reconciled   Medication History  Valtrex (500MG  Tablet, 2 tabs Oral as needed) Active. Hydrocodone-Acetaminophen (5-325MG  Tablet, Oral) Active. Medications Reconciled  Vitals  Weight: 165.4 lb Height:  64in Body Surface Area: 1.8 m Body Mass Index: 28.39 kg/m  Temp.: 98.108F  Pulse: 96 (Regular)  BP: 142/90 (Sitting, Left Arm, Standard)       Physical Exam  General Note: Alert. Pleasant. No distress.  Chest and Lung Exam Note: Right axillary incision soft. Well-healed. No residual fluid. Infraclavicular port looks fine. Lungs clear  CV;  RRR without ectopy or murmer.  Breast Note: Right lumpectomy incision and right axilla incision looked very good. Contour excellent. Shape excellent. Nipple projection excellent. Minimal volume loss.     Assessment & Plan  PRIMARY CANCER OF UPPER INNER QUADRANT OF RIGHT FEMALE BREAST (C50.211)     You have from your right breast lumpectomy and axillary lymph node dissection without any obvious surgical complications Range of motion of your right shoulder is excellent We have discussed your pathology report with Dr. Marin Olp You have completed radiation therapy and chemotherapy.  Your Port-A-Cath can be removed    PRIMARY MALIGNANT NEOPLASM OF RIGHT BREAST WITH METASTASIS TO MOVABLE IPSILATERAL LEVEL 1 OR 2 AXILLARY LYMPH NODES (N1) (C50.911)   Bennetta Rudden M. Dalbert Batman, M.D., [facility] General and Minimally invasive Surgery Breast and Colorectal Surgery

## 2019-01-15 NOTE — Telephone Encounter (Signed)
Appointments scheduled letter/calendar mailed per 12/23 los

## 2019-01-17 ENCOUNTER — Other Ambulatory Visit (HOSPITAL_COMMUNITY): Payer: BC Managed Care – PPO

## 2019-01-17 ENCOUNTER — Other Ambulatory Visit (HOSPITAL_COMMUNITY): Payer: BC Managed Care – PPO | Attending: General Surgery

## 2019-01-21 ENCOUNTER — Ambulatory Visit (HOSPITAL_BASED_OUTPATIENT_CLINIC_OR_DEPARTMENT_OTHER): Admission: RE | Admit: 2019-01-21 | Payer: BC Managed Care – PPO | Source: Home / Self Care | Admitting: General Surgery

## 2019-01-21 ENCOUNTER — Encounter (HOSPITAL_BASED_OUTPATIENT_CLINIC_OR_DEPARTMENT_OTHER): Admission: RE | Payer: Self-pay | Source: Home / Self Care

## 2019-01-21 SURGERY — REMOVAL PORT-A-CATH
Anesthesia: Monitor Anesthesia Care

## 2019-02-10 ENCOUNTER — Other Ambulatory Visit: Payer: Self-pay | Admitting: Hematology & Oncology

## 2019-05-07 NOTE — Progress Notes (Signed)
Pharmacist Chemotherapy Monitoring - Follow Up Assessment    I verify that I have reviewed each item in the below checklist:  . Regimen for the patient is scheduled for the appropriate day and plan matches scheduled date. Marland Kitchen Appropriate non-routine labs are ordered dependent on drug ordered. . If applicable, additional medications reviewed and ordered per protocol based on lifetime cumulative doses and/or treatment regimen.   Plan for follow-up and/or issues identified: Yes . I-vent associated with next due treatment: Yes . MD and/or nursing notified: No  Christina Joseph, Christina Joseph 05/07/2019 8:03 AM

## 2019-05-14 ENCOUNTER — Inpatient Hospital Stay: Payer: BC Managed Care – PPO | Attending: Family

## 2019-05-14 ENCOUNTER — Encounter: Payer: Self-pay | Admitting: Family

## 2019-05-14 ENCOUNTER — Other Ambulatory Visit: Payer: BC Managed Care – PPO

## 2019-05-14 ENCOUNTER — Other Ambulatory Visit: Payer: Self-pay

## 2019-05-14 ENCOUNTER — Inpatient Hospital Stay: Payer: BC Managed Care – PPO

## 2019-05-14 ENCOUNTER — Inpatient Hospital Stay (HOSPITAL_BASED_OUTPATIENT_CLINIC_OR_DEPARTMENT_OTHER): Payer: BC Managed Care – PPO | Admitting: Family

## 2019-05-14 ENCOUNTER — Ambulatory Visit: Payer: BC Managed Care – PPO

## 2019-05-14 ENCOUNTER — Ambulatory Visit: Payer: BC Managed Care – PPO | Admitting: Hematology & Oncology

## 2019-05-14 VITALS — BP 150/84 | HR 69 | Temp 97.5°F | Resp 18 | Ht 64.17 in | Wt 141.1 lb

## 2019-05-14 DIAGNOSIS — Z170 Estrogen receptor positive status [ER+]: Secondary | ICD-10-CM | POA: Insufficient documentation

## 2019-05-14 DIAGNOSIS — C50211 Malignant neoplasm of upper-inner quadrant of right female breast: Secondary | ICD-10-CM

## 2019-05-14 DIAGNOSIS — C50911 Malignant neoplasm of unspecified site of right female breast: Secondary | ICD-10-CM | POA: Insufficient documentation

## 2019-05-14 DIAGNOSIS — C773 Secondary and unspecified malignant neoplasm of axilla and upper limb lymph nodes: Secondary | ICD-10-CM | POA: Diagnosis not present

## 2019-05-14 DIAGNOSIS — Z79811 Long term (current) use of aromatase inhibitors: Secondary | ICD-10-CM | POA: Diagnosis not present

## 2019-05-14 LAB — CBC WITH DIFFERENTIAL (CANCER CENTER ONLY)
Abs Immature Granulocytes: 0.02 10*3/uL (ref 0.00–0.07)
Basophils Absolute: 0 10*3/uL (ref 0.0–0.1)
Basophils Relative: 0 %
Eosinophils Absolute: 0.1 10*3/uL (ref 0.0–0.5)
Eosinophils Relative: 2 %
HCT: 36.1 % (ref 36.0–46.0)
Hemoglobin: 11.6 g/dL — ABNORMAL LOW (ref 12.0–15.0)
Immature Granulocytes: 0 %
Lymphocytes Relative: 30 %
Lymphs Abs: 1.6 10*3/uL (ref 0.7–4.0)
MCH: 29 pg (ref 26.0–34.0)
MCHC: 32.1 g/dL (ref 30.0–36.0)
MCV: 90.3 fL (ref 80.0–100.0)
Monocytes Absolute: 0.3 10*3/uL (ref 0.1–1.0)
Monocytes Relative: 6 %
Neutro Abs: 3.1 10*3/uL (ref 1.7–7.7)
Neutrophils Relative %: 62 %
Platelet Count: 173 10*3/uL (ref 150–400)
RBC: 4 MIL/uL (ref 3.87–5.11)
RDW: 13.2 % (ref 11.5–15.5)
WBC Count: 5.1 10*3/uL (ref 4.0–10.5)
nRBC: 0 % (ref 0.0–0.2)

## 2019-05-14 LAB — CMP (CANCER CENTER ONLY)
ALT: 11 U/L (ref 0–44)
AST: 14 U/L — ABNORMAL LOW (ref 15–41)
Albumin: 4.5 g/dL (ref 3.5–5.0)
Alkaline Phosphatase: 54 U/L (ref 38–126)
Anion gap: 4 — ABNORMAL LOW (ref 5–15)
BUN: 16 mg/dL (ref 6–20)
CO2: 33 mmol/L — ABNORMAL HIGH (ref 22–32)
Calcium: 9.9 mg/dL (ref 8.9–10.3)
Chloride: 105 mmol/L (ref 98–111)
Creatinine: 0.71 mg/dL (ref 0.44–1.00)
GFR, Est AFR Am: >60 mL/min (ref >60)
GFR, Estimated: >60 mL/min (ref >60)
Glucose, Bld: 80 mg/dL (ref 70–99)
Potassium: 4.3 mmol/L (ref 3.5–5.1)
Sodium: 142 mmol/L (ref 135–145)
Total Bilirubin: 0.3 mg/dL (ref 0.3–1.2)
Total Protein: 7.1 g/dL (ref 6.5–8.1)

## 2019-05-14 MED ORDER — DENOSUMAB 60 MG/ML ~~LOC~~ SOSY
60.0000 mg | PREFILLED_SYRINGE | Freq: Once | SUBCUTANEOUS | Status: AC
  Administered 2019-05-14: 60 mg via SUBCUTANEOUS

## 2019-05-14 MED ORDER — DENOSUMAB 60 MG/ML ~~LOC~~ SOSY
PREFILLED_SYRINGE | SUBCUTANEOUS | Status: AC
  Filled 2019-05-14: qty 1

## 2019-05-14 NOTE — Patient Instructions (Signed)
Denosumab injection What is this medicine? DENOSUMAB (den oh sue mab) slows bone breakdown. Prolia is used to treat osteoporosis in women after menopause and in men, and in people who are taking corticosteroids for 6 months or more. Xgeva is used to treat a high calcium level due to cancer and to prevent bone fractures and other bone problems caused by multiple myeloma or cancer bone metastases. Xgeva is also used to treat giant cell tumor of the bone. This medicine may be used for other purposes; ask your health care provider or pharmacist if you have questions. COMMON BRAND NAME(S): Prolia, XGEVA What should I tell my health care provider before I take this medicine? They need to know if you have any of these conditions:  dental disease  having surgery or tooth extraction  infection  kidney disease  low levels of calcium or Vitamin D in the blood  malnutrition  on hemodialysis  skin conditions or sensitivity  thyroid or parathyroid disease  an unusual reaction to denosumab, other medicines, foods, dyes, or preservatives  pregnant or trying to get pregnant  breast-feeding How should I use this medicine? This medicine is for injection under the skin. It is given by a health care professional in a hospital or clinic setting. A special MedGuide will be given to you before each treatment. Be sure to read this information carefully each time. For Prolia, talk to your pediatrician regarding the use of this medicine in children. Special care may be needed. For Xgeva, talk to your pediatrician regarding the use of this medicine in children. While this drug may be prescribed for children as young as 13 years for selected conditions, precautions do apply. Overdosage: If you think you have taken too much of this medicine contact a poison control center or emergency room at once. NOTE: This medicine is only for you. Do not share this medicine with others. What if I miss a dose? It is  important not to miss your dose. Call your doctor or health care professional if you are unable to keep an appointment. What may interact with this medicine? Do not take this medicine with any of the following medications:  other medicines containing denosumab This medicine may also interact with the following medications:  medicines that lower your chance of fighting infection  steroid medicines like prednisone or cortisone This list may not describe all possible interactions. Give your health care provider a list of all the medicines, herbs, non-prescription drugs, or dietary supplements you use. Also tell them if you smoke, drink alcohol, or use illegal drugs. Some items may interact with your medicine. What should I watch for while using this medicine? Visit your doctor or health care professional for regular checks on your progress. Your doctor or health care professional may order blood tests and other tests to see how you are doing. Call your doctor or health care professional for advice if you get a fever, chills or sore throat, or other symptoms of a cold or flu. Do not treat yourself. This drug may decrease your body's ability to fight infection. Try to avoid being around people who are sick. You should make sure you get enough calcium and vitamin D while you are taking this medicine, unless your doctor tells you not to. Discuss the foods you eat and the vitamins you take with your health care professional. See your dentist regularly. Brush and floss your teeth as directed. Before you have any dental work done, tell your dentist you are   receiving this medicine. Do not become pregnant while taking this medicine or for 5 months after stopping it. Talk with your doctor or health care professional about your birth control options while taking this medicine. Women should inform their doctor if they wish to become pregnant or think they might be pregnant. There is a potential for serious side  effects to an unborn child. Talk to your health care professional or pharmacist for more information. What side effects may I notice from receiving this medicine? Side effects that you should report to your doctor or health care professional as soon as possible:  allergic reactions like skin rash, itching or hives, swelling of the face, lips, or tongue  bone pain  breathing problems  dizziness  jaw pain, especially after dental work  redness, blistering, peeling of the skin  signs and symptoms of infection like fever or chills; cough; sore throat; pain or trouble passing urine  signs of low calcium like fast heartbeat, muscle cramps or muscle pain; pain, tingling, numbness in the hands or feet; seizures  unusual bleeding or bruising  unusually weak or tired Side effects that usually do not require medical attention (report to your doctor or health care professional if they continue or are bothersome):  constipation  diarrhea  headache  joint pain  loss of appetite  muscle pain  runny nose  tiredness  upset stomach This list may not describe all possible side effects. Call your doctor for medical advice about side effects. You may report side effects to FDA at 1-800-FDA-1088. Where should I keep my medicine? This medicine is only given in a clinic, doctor's office, or other health care setting and will not be stored at home. NOTE: This sheet is a summary. It may not cover all possible information. If you have questions about this medicine, talk to your doctor, pharmacist, or health care provider.  2020 Elsevier/Gold Standard (2017-05-17 16:10:44)

## 2019-05-14 NOTE — Progress Notes (Addendum)
Hematology and Oncology Follow Up Visit  Christina Joseph [id number] 1965/08/27 54 y.o. 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 She is doing quite well and is so glad to be finished with Nerlynx. She has no complaints at this time.  She is tolerating Femara well and has had no hot flashes or night sweats.  Breast exam today was unremarkable.  No adenopathy noted.  No fever, chills, n/v, cough, rash, dizziness, SOB, chest pain, palpitations, abdominal pain or changes in bowel or bladder habits.  She has had no more issues with diarrhea.  No episodes of bleeding. No bruising or petechiae.  No swelling, tenderness, numbness or tingling in her extremities.  No falls or syncopal episodes to report.  She has a good appetite and is staying well hydrated. Her weight is stable.   ECOG Performance Status: 1 - Symptomatic but completely ambulatory  Medications:  Allergies as of 05/14/2019   No Known Allergies     Medication List       Accurate as of May 14, 2019 10:40 AM. If you have any questions, ask your nurse or doctor.        amoxicillin 500 MG tablet Commonly known as: AMOXIL Take '2000mg'$  once, one hour prior to the dental procedure.   letrozole 2.5 MG tablet Commonly known as: Christina Joseph 1 TABLET(2.5 MG) BY MOUTH DAILY   loperamide 2 MG capsule Commonly known as: IMODIUM TAKE 2 CAPSULES BY MOUTH THREE TIMES DAILY FOR 2 WEEKS THEN TAKE 2 CAPSULES BY MOUTH TWICE  DAILY FOR 6 WEEKS AS NEEDED   valACYclovir 1000 MG tablet Commonly known as: VALTREX TAKE 1 TABLET(1000 MG) BY MOUTH DAILY AS NEEDED       Allergies: No Known Allergies  Past Medical History, Surgical history, Social history, and Family History were reviewed and updated.  Review of Systems: All other 10 point review of systems is negative.   Physical Exam:  vitals were not taken for this visit.   Wt Readings from Last 3 Encounters:  01/14/19 142 lb 1.3 oz (64.4 kg)  10/15/18 152 lb (68.9 kg)  07/03/18 173 lb (78.5 kg)    Ocular: Sclerae unicteric, pupils equal, round and reactive to light Ear-nose-throat: Oropharynx clear, dentition fair Lymphatic: No cervical, supraclavicular or axillary adenopathy Lungs no rales or rhonchi, good excursion bilaterally Heart regular rate and rhythm, no murmur appreciated Abd soft, nontender, positive bowel sounds, no liver or spleen tip palpated on exam, no fluid wave  MSK no focal spinal tenderness, no joint edema Neuro: non-focal, well-oriented, appropriate affect Breasts: No changes on exam. No redness, edema, mass, lesion or rash noted.   Lab Results  Component Value Date   WBC 5.1 05/14/2019   HGB 11.6 (L) 05/14/2019   HCT 36.1 05/14/2019   MCV 90.3 05/14/2019   PLT 173 05/14/2019   No results found for: FERRITIN, IRON, TIBC, UIBC, IRONPCTSAT Lab Results  Component Value Date  RBC 4.00 05/14/2019   No results found for: KPAFRELGTCHN, LAMBDASER, KAPLAMBRATIO No results found for: IGGSERUM, IGA, IGMSERUM No results found for: Christina Joseph, SPEI   Chemistry      Component Value Date/Time   NA 142 05/14/2019 1004   NA 144 01/09/2017 0927   K 4.3 05/14/2019 1004   K 3.4 01/09/2017 0927   CL 105 05/14/2019 1004   CL 105 01/09/2017 0927   CO2 33 (H) 05/14/2019 1004   CO2 29 01/09/2017 0927   BUN 16 05/14/2019 1004   BUN 7 01/09/2017 0927   CREATININE 0.71 05/14/2019  1004   CREATININE 0.9 01/09/2017 0927      Component Value Date/Time   CALCIUM 9.9 05/14/2019 1004   CALCIUM 9.1 01/09/2017 0927   ALKPHOS 54 05/14/2019 1004   ALKPHOS 68 01/09/2017 0927   AST 14 (L) 05/14/2019 1004   ALT 11 05/14/2019 1004   ALT 26 01/09/2017 0927   BILITOT 0.3 05/14/2019 1004       Impression and Plan: Christina Joseph is a very pleasant 54 yo postmenopausal caucasian female with stage IIIa invasive ductal carcinoma of the right breast with lumpectomy.  She continues to do well and so far there has been no evidence of recurrence.  No changes with Femara, she will continue her same regimen.  She received Prolia today.  We will plan to see her back again in 6 months.  She will contact our office with any questions or concerns. We can certainly see her sooner if needed.   Christina Peace, NP 4/22/202110:40 AM

## 2019-07-05 IMAGING — CT CT CHEST W/ CM
2 of 3 series · 14 of 36 positions shown, 17 images · IV contrast (iopamidol)
Comparison: None available at the time of this dictation.

CLINICAL DATA: Stage III right breast cancer diagnosed March 2016,
presenting for restaging status post neoadjuvant chemotherapy
reportedly completed 1 day prior. Reported history of left lower
lobe pulmonary nodule on pre-chemotherapy scan at an outside
institution in April 2016.

EXAM:
CT CHEST WITH CONTRAST
TECHNIQUE: Multidetector CT imaging of the chest was performed during
intravenous contrast administration.
CONTRAST:  80mL [id number] IOPAMIDOL ([id number]-533) INJECTION 61%

[Series 2: axial st · axial · 0.79mm/px · z∈[-269,-39]mm · 11 of 136 slices shown, 14 images]
[im 11/136  mediastinal]
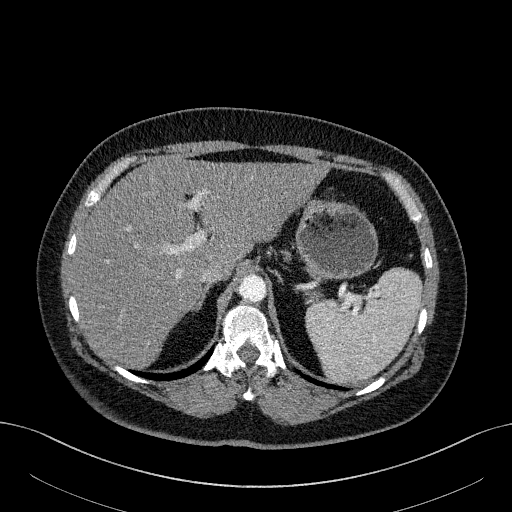
[im 11/136  lung]
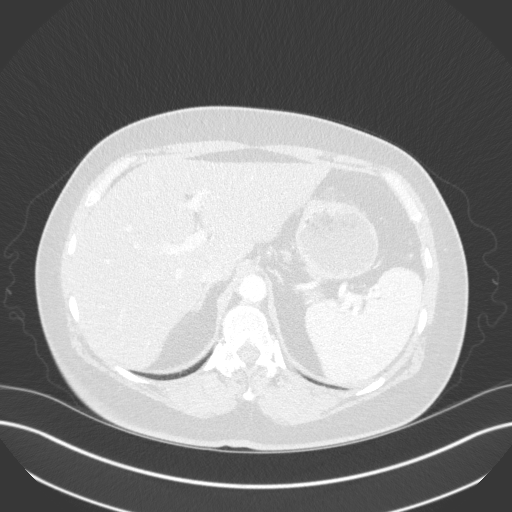
[im 21/136  lung]
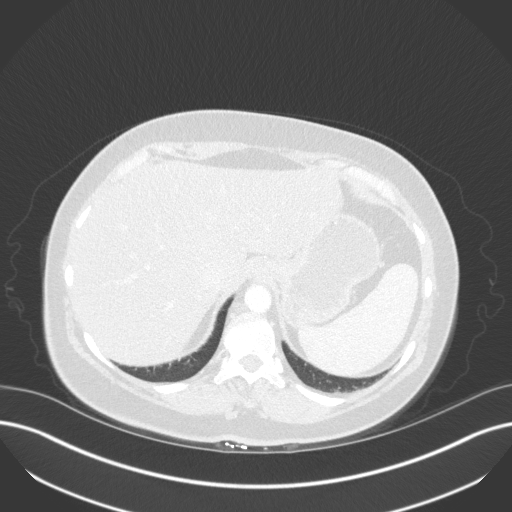
[im 31/136  lung]
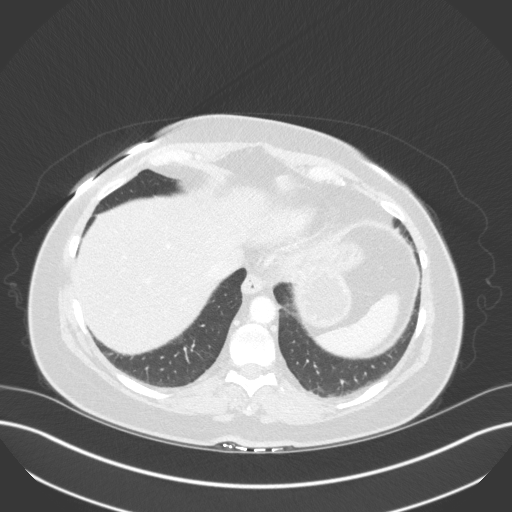
[im 46/136  lung]
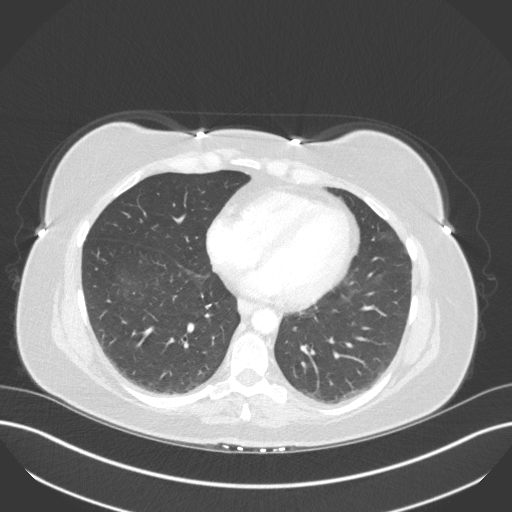
[im 56/136  mediastinal]
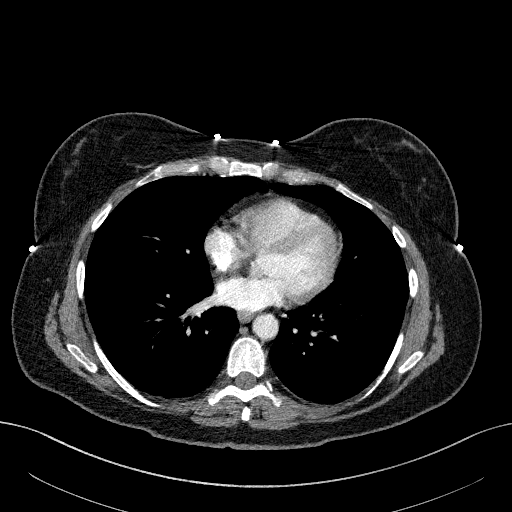
[im 56/136  lung]
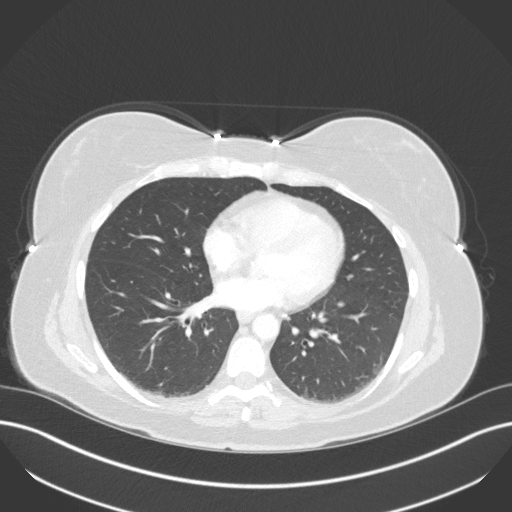
[im 71/136  lung]
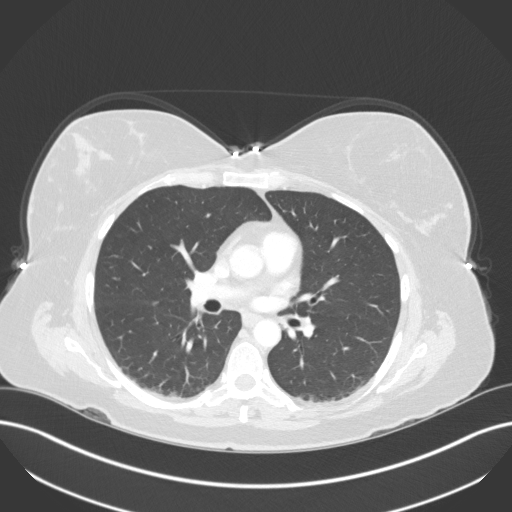
[im 81/136  lung]
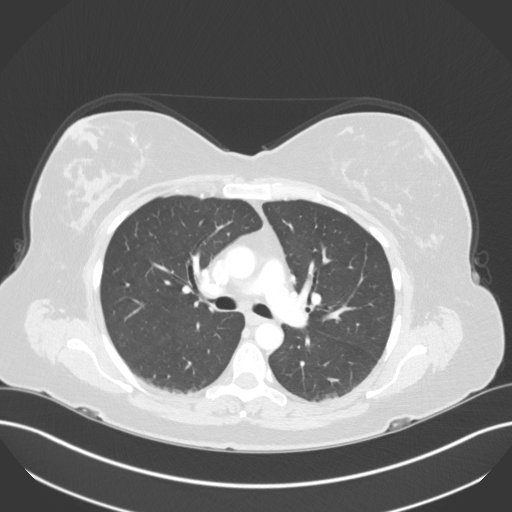
[im 91/136  lung]
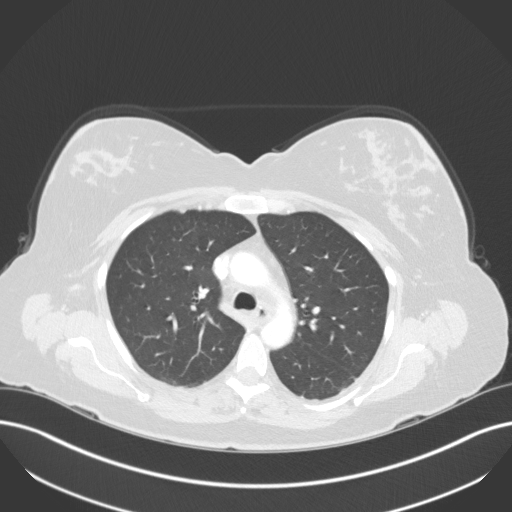
[im 106/136  mediastinal]
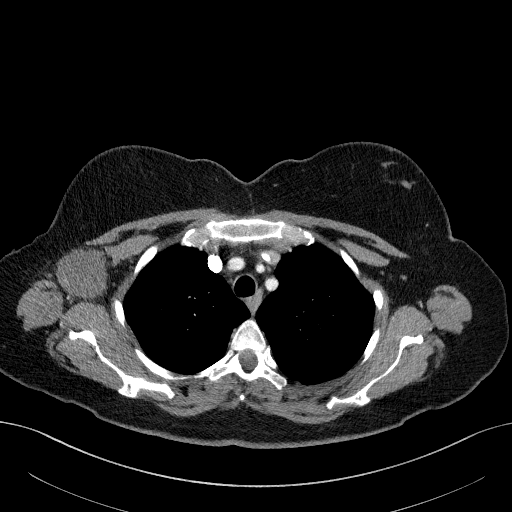
[im 106/136  lung]
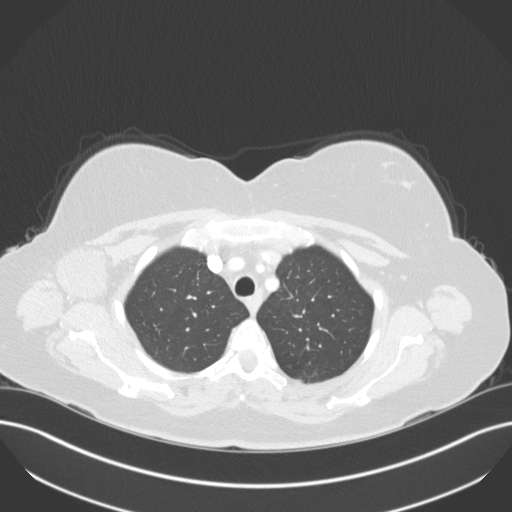
[im 116/136  lung]
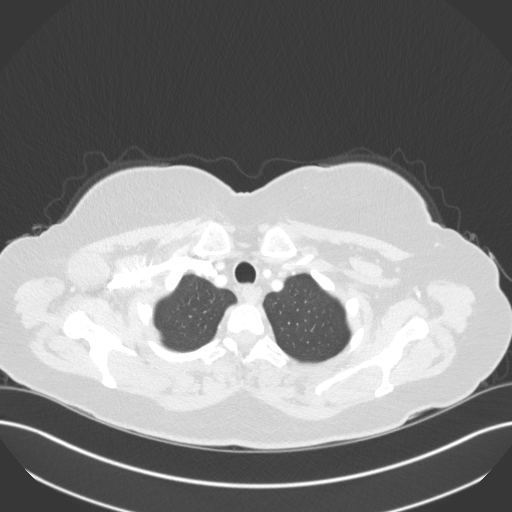
[im 126/136  lung]
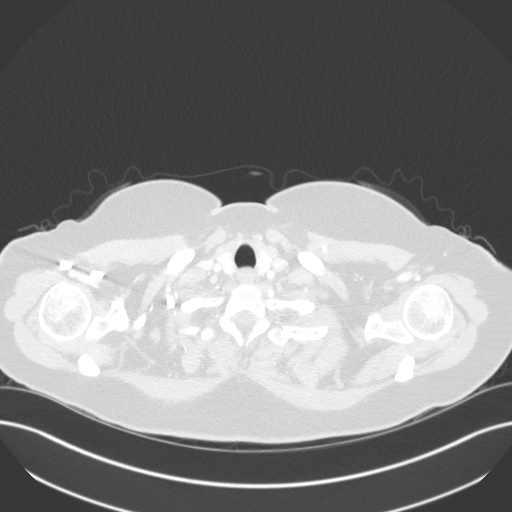

[Series 5: coronal · coronal · 0.56mm/px · 3 of 142 slices shown]
[im 29/142  lung]
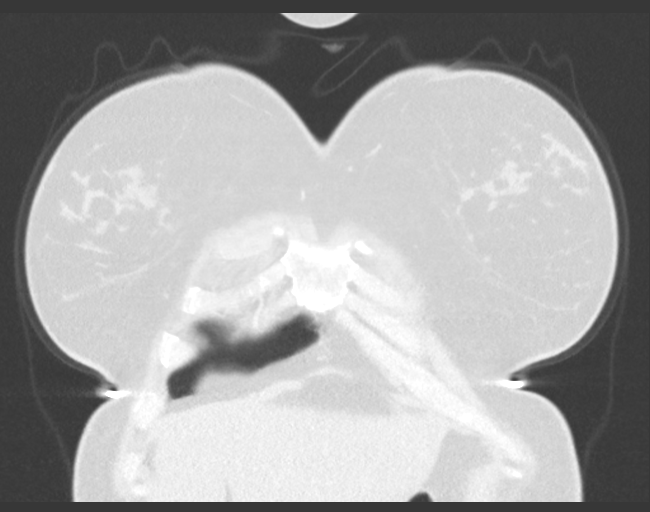
[im 57/142  lung]
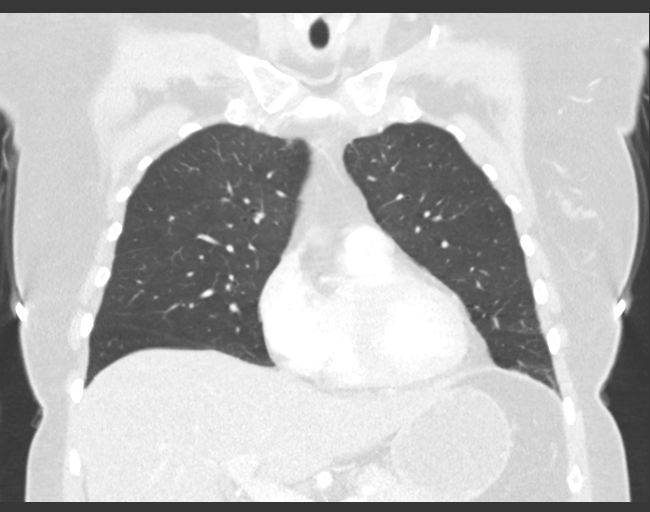
[im 85/142  lung]
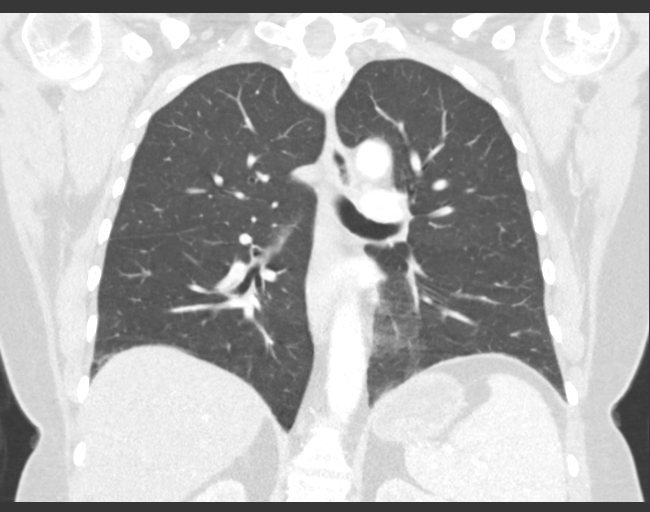

[14 of 36 positions shown; findings below may reference images not displayed]

FINDINGS: Cardiovascular: Normal heart size. No significant pericardial
fluid/thickening. Left anterior descending coronary atherosclerosis.
Left subclavian MediPort terminates in the middle third of the
superior vena cava. Thoracic aorta is normal in course and caliber.
Normal caliber pulmonary arteries. No central pulmonary emboli.

Mediastinum/Nodes: No discrete thyroid nodules. Unremarkable
esophagus. Bulky 4.0 cm right axillary lymph node (series 2/ image
27) with hazy margins. No additional pathologically enlarged
axillary, mediastinal or hilar lymph nodes.

Lungs/Pleura: No pneumothorax. No pleural effusion. No acute
consolidative airspace disease or lung masses. Anterior left lower
lobe subpleural solid 3 mm pulmonary nodule (series 3/ image 82). No
additional significant pulmonary nodules.

Upper abdomen: Hypodense subcentimeter central splenic lesion
(series 2/ image 132).

Musculoskeletal: Partially visualized mixed lytic and sclerotic
lesion in the proximal left humeral metaphysis with wide zone of
transition. No additional focal osseous lesions. Mild thoracic
spondylosis.
IMPRESSION: 1. Solitary bulky enlarged right axillary lymph node compatible with
nodal metastasis. No additional thoracic adenopathy.
2. Solitary tiny solid 3 mm subpleural left lower lobe pulmonary
nodule, for which follow-up chest CT is advised in 3-6 months.
3. Partially visualized mixed lytic and sclerotic lesion in the
proximal left humeral metaphysis, incompletely evaluated on this
scan. Findings may represent a bone infarct, although a chondroid
lesion is not excluded. Appearance is not typical for breast cancer
bone metastasis. Recommend dedicated left humerus radiographs and/or
PET-CT for further evaluation.
4. Subcentimeter low-attenuation central splenic lesion, too small
to characterize. Recommend attention on follow-up MRI abdomen
without and with IV contrast in 3-6 months.
5. One vessel coronary atherosclerosis.

## 2019-09-23 ENCOUNTER — Other Ambulatory Visit: Payer: Self-pay | Admitting: Hematology & Oncology

## 2019-09-23 DIAGNOSIS — C50211 Malignant neoplasm of upper-inner quadrant of right female breast: Secondary | ICD-10-CM

## 2020-01-18 ENCOUNTER — Other Ambulatory Visit: Payer: Self-pay

## 2020-01-18 ENCOUNTER — Encounter: Payer: Self-pay | Admitting: Hematology & Oncology

## 2020-01-18 ENCOUNTER — Inpatient Hospital Stay (HOSPITAL_BASED_OUTPATIENT_CLINIC_OR_DEPARTMENT_OTHER): Payer: BC Managed Care – PPO | Admitting: Hematology & Oncology

## 2020-01-18 ENCOUNTER — Inpatient Hospital Stay: Payer: BC Managed Care – PPO

## 2020-01-18 ENCOUNTER — Inpatient Hospital Stay: Payer: BC Managed Care – PPO | Attending: Hematology & Oncology

## 2020-01-18 VITALS — BP 123/74 | HR 77 | Temp 98.7°F | Resp 18 | Wt 140.0 lb

## 2020-01-18 DIAGNOSIS — Z170 Estrogen receptor positive status [ER+]: Secondary | ICD-10-CM | POA: Insufficient documentation

## 2020-01-18 DIAGNOSIS — Z923 Personal history of irradiation: Secondary | ICD-10-CM | POA: Diagnosis not present

## 2020-01-18 DIAGNOSIS — C50911 Malignant neoplasm of unspecified site of right female breast: Secondary | ICD-10-CM | POA: Insufficient documentation

## 2020-01-18 LAB — CMP (CANCER CENTER ONLY)
ALT: 9 U/L (ref 0–44)
AST: 14 U/L — ABNORMAL LOW (ref 15–41)
Albumin: 4.4 g/dL (ref 3.5–5.0)
Alkaline Phosphatase: 54 U/L (ref 38–126)
Anion gap: 7 (ref 5–15)
BUN: 11 mg/dL (ref 6–20)
CO2: 31 mmol/L (ref 22–32)
Calcium: 10.2 mg/dL (ref 8.9–10.3)
Chloride: 104 mmol/L (ref 98–111)
Creatinine: 0.72 mg/dL (ref 0.44–1.00)
GFR, Estimated: >60 mL/min (ref >60)
Glucose, Bld: 85 mg/dL (ref 70–99)
Potassium: 4.1 mmol/L (ref 3.5–5.1)
Sodium: 142 mmol/L (ref 135–145)
Total Bilirubin: 0.4 mg/dL (ref 0.3–1.2)
Total Protein: 7 g/dL (ref 6.5–8.1)

## 2020-01-18 LAB — CBC WITH DIFFERENTIAL (CANCER CENTER ONLY)
Abs Immature Granulocytes: 0.01 10*3/uL (ref 0.00–0.07)
Basophils Absolute: 0 10*3/uL (ref 0.0–0.1)
Basophils Relative: 1 %
Eosinophils Absolute: 0.1 10*3/uL (ref 0.0–0.5)
Eosinophils Relative: 3 %
HCT: 37.1 % (ref 36.0–46.0)
Hemoglobin: 12.2 g/dL (ref 12.0–15.0)
Immature Granulocytes: 0 %
Lymphocytes Relative: 30 %
Lymphs Abs: 1.4 10*3/uL (ref 0.7–4.0)
MCH: 30.1 pg (ref 26.0–34.0)
MCHC: 32.9 g/dL (ref 30.0–36.0)
MCV: 91.6 fL (ref 80.0–100.0)
Monocytes Absolute: 0.3 10*3/uL (ref 0.1–1.0)
Monocytes Relative: 6 %
Neutro Abs: 3 10*3/uL (ref 1.7–7.7)
Neutrophils Relative %: 60 %
Platelet Count: 198 10*3/uL (ref 150–400)
RBC: 4.05 MIL/uL (ref 3.87–5.11)
RDW: 12.8 % (ref 11.5–15.5)
WBC Count: 4.8 10*3/uL (ref 4.0–10.5)
nRBC: 0 % (ref 0.0–0.2)

## 2020-01-18 NOTE — Progress Notes (Signed)
Hematology and Oncology Follow Up Visit  Christina Joseph [id number] February 04, 1965 54 y.o. 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 -She is doing quite well.  She is off for the Christmas holiday from work.  She works as a Pharmacist, hospital.  Her daughter is coming home soon.  Her daughter is married to a Therapist, art man.  They currently live in [place].  I am so happy for her.  Should be home and then she will move to [place].  Christina Joseph has had no problems with health.  She been very active.  She has had no problems with nausea or vomiting.  Is been no cough or shortness of breath issues.  She has had no issues with the coronavirus.  She has had no problems with fever.  Is been no cough or shortness of breath.  She has had no rash.  Currently, she is in need of a diagnostic mammogram.  We will get this set up for her.  Overall, her performance status is ECOG 0.    Medications:  Allergies as of 01/18/2020   No Known Allergies     Medication List       Accurate as of January 18, 2020 10:48 AM. If you have any questions, ask your nurse or doctor.        STOP taking these medications   amoxicillin 500 MG tablet Commonly known as: AMOXIL Stopped by: [name], MD   loperamide 2 MG capsule Commonly known as: IMODIUM Stopped by: [name], MD     TAKE these medications   letrozole 2.5 MG tablet Commonly known as: [place] 1 TABLET(2.5 MG) BY MOUTH DAILY   valACYclovir 1000 MG tablet Commonly known as: VALTREX TAKE 1  TABLET(1000 MG) BY MOUTH DAILY AS NEEDED       Allergies: No Known Allergies  Past Medical History, Surgical history, Social history, and Family History were reviewed and updated.  Review of Systems: Review of Systems  Constitutional: Negative.   HENT: Negative.   Eyes: Negative.   Respiratory: Negative.   Cardiovascular: Negative.   Gastrointestinal: Negative.   Genitourinary: Negative.   Musculoskeletal: Negative.   Skin: Negative.   Neurological: Negative.   Endo/Heme/Allergies: Negative.   Psychiatric/Behavioral: Negative.     Physical Exam:  weight is 140 lb (63.5 kg). Her oral temperature is 98.7 F (37.1 C). Her blood pressure is 123/74 and her pulse is 77. Her respiration is 18 and oxygen saturation is 99%.   Wt Readings from Last 3 Encounters:  01/18/20 140 lb (63.5 kg)  05/14/19 141 lb 1.3 oz (64 kg)  01/14/19 142 lb 1.3 oz (64.4 kg)    Physical Exam Vitals reviewed.  Constitutional:      Comments: Her breast exam shows left breast with no masses, edema or erythema.  She has no left axillary adenopathy.  Right breast shows the well-healed lumpectomy scar at about the 2 o'clock position.  There is no firmness.  There is no nipple discharge.  She  has the right axillary lymphadenectomy scar that is well-healed.  There is no lymphadenopathy in the right axilla.  HENT:     Head: Normocephalic and atraumatic.     Mouth/Throat:     Mouth: Oropharynx is clear and moist.  Eyes:     Extraocular Movements: EOM normal.     Pupils: Pupils are equal, round, and reactive to light.  Cardiovascular:     Rate and Rhythm: Normal rate and regular rhythm.     Heart sounds: Normal heart sounds.  Pulmonary:     Effort: Pulmonary effort is normal.     Breath sounds: Normal breath sounds.  Abdominal:     General: Bowel sounds are normal.     Palpations: Abdomen is soft.  Musculoskeletal:        General: No tenderness, deformity or edema. Normal range of motion.     Cervical  back: Normal range of motion.  Lymphadenopathy:     Cervical: No cervical adenopathy.  Skin:    General: Skin is warm and dry.     Findings: No erythema or rash.  Neurological:     Mental Status: She is alert and oriented to person, place, and time.  Psychiatric:        Mood and Affect: Mood and affect normal.        Behavior: Behavior normal.        Thought Content: Thought content normal.        Judgment: Judgment normal.      Lab Results  Component Value Date   WBC 4.8 01/18/2020   HGB 12.2 01/18/2020   HCT 37.1 01/18/2020   MCV 91.6 01/18/2020   PLT 198 01/18/2020   No results found for: FERRITIN, IRON, TIBC, UIBC, IRONPCTSAT Lab Results  Component Value Date   RBC 4.05 01/18/2020   No results found for: KPAFRELGTCHN, LAMBDASER, KAPLAMBRATIO No results found for: IGGSERUM, IGA, IGMSERUM No results found for: TOTALPROTELP, ALBUMINELP, A1GS, A2GS, BETS, BETA2SER, GAMS, MSPIKE, SPEI   Chemistry      Component Value Date/Time   NA 142 01/18/2020 0941   NA 144 01/09/2017 0927   K 4.1 01/18/2020 0941   K 3.4 01/09/2017 0927   CL 104 01/18/2020 0941   CL 105 01/09/2017 0927   CO2 31 01/18/2020 0941   CO2 29 01/09/2017 0927   BUN 11 01/18/2020 0941   BUN 7 01/09/2017 0927   CREATININE 0.72 01/18/2020 0941   CREATININE 0.9 01/09/2017 0927      Component Value Date/Time   CALCIUM 10.2 01/18/2020 0941   CALCIUM 9.1 01/09/2017 0927   ALKPHOS 54 01/18/2020 0941   ALKPHOS 68 01/09/2017 0927   AST 14 (L) 01/18/2020 0941   ALT 9 01/18/2020 0941   ALT 26 01/09/2017 0927   BILITOT 0.4 01/18/2020 0941       Impression and Plan: Christina Joseph is a very pleasant 54 yo postmenopausal caucasian female with stage IIIa invasive ductal carcinoma of the right breast with lumpectomy.   She has not seen us for about 8 months.  Again she is doing well.  She is on Femara.  Unfortunately, her insurance company did not approve the Prolia.  She had to pay for the Prolia last time.  This  is very very sad.  We will go ahead get the diagnostic mammogram.  I will see her back in 6 more months.  I am just happy for her that she had a good year.  Hopefully next time that we   see her, she will let us know she is going be an expectant grandmother.   Peter R Ennever, MD 12/27/202110:48 AM 

## 2020-01-18 NOTE — Patient Instructions (Signed)

## 2020-01-18 NOTE — Progress Notes (Signed)
Patient scheduled for port flush today, states she was supposed to have port removed during the summer and couldn't afford it, states it hasnt been working the last few times it was accessed but would like to try it today for lab work. Accessed port, flushed good, no blood return noted, patient declined TPA today, port deaccessed and patient taken back to lab for lab draw.

## 2020-02-10 ENCOUNTER — Other Ambulatory Visit: Payer: Self-pay | Admitting: Hematology & Oncology

## 2020-02-10 ENCOUNTER — Other Ambulatory Visit: Payer: Self-pay

## 2020-02-10 ENCOUNTER — Ambulatory Visit
Admission: RE | Admit: 2020-02-10 | Discharge: 2020-02-10 | Disposition: A | Discharge status: Home / Self Care | Payer: BC Managed Care – PPO | Source: Ambulatory Visit | Attending: Hematology & Oncology | Admitting: Hematology & Oncology

## 2020-02-10 DIAGNOSIS — C50911 Malignant neoplasm of unspecified site of right female breast: Secondary | ICD-10-CM

## 2020-02-10 DIAGNOSIS — Z170 Estrogen receptor positive status [ER+]: Secondary | ICD-10-CM

## 2020-02-18 ENCOUNTER — Ambulatory Visit
Admission: RE | Admit: 2020-02-18 | Discharge: 2020-02-18 | Disposition: A | Discharge status: Home / Self Care | Payer: BC Managed Care – PPO | Source: Ambulatory Visit | Attending: Hematology & Oncology | Admitting: Hematology & Oncology

## 2020-02-18 ENCOUNTER — Other Ambulatory Visit: Payer: Self-pay

## 2020-02-18 DIAGNOSIS — C50911 Malignant neoplasm of unspecified site of right female breast: Secondary | ICD-10-CM

## 2020-02-18 DIAGNOSIS — Z170 Estrogen receptor positive status [ER+]: Secondary | ICD-10-CM

## 2020-06-18 ENCOUNTER — Other Ambulatory Visit: Payer: Self-pay | Admitting: Hematology & Oncology

## 2020-06-18 DIAGNOSIS — C50211 Malignant neoplasm of upper-inner quadrant of right female breast: Secondary | ICD-10-CM

## 2020-06-18 DIAGNOSIS — Z170 Estrogen receptor positive status [ER+]: Secondary | ICD-10-CM

## 2020-07-18 ENCOUNTER — Encounter: Payer: Self-pay | Admitting: Hematology & Oncology

## 2020-07-18 ENCOUNTER — Inpatient Hospital Stay: Payer: BC Managed Care – PPO | Admitting: Hematology & Oncology

## 2020-07-18 ENCOUNTER — Inpatient Hospital Stay: Payer: BC Managed Care – PPO | Attending: Hematology & Oncology

## 2020-07-18 ENCOUNTER — Other Ambulatory Visit: Payer: Self-pay

## 2020-07-18 ENCOUNTER — Telehealth: Payer: Self-pay

## 2020-07-18 VITALS — BP 128/85 | HR 76 | Temp 99.1°F | Resp 17 | Wt 145.8 lb

## 2020-07-18 DIAGNOSIS — C50911 Malignant neoplasm of unspecified site of right female breast: Secondary | ICD-10-CM | POA: Insufficient documentation

## 2020-07-18 DIAGNOSIS — Z923 Personal history of irradiation: Secondary | ICD-10-CM | POA: Diagnosis not present

## 2020-07-18 DIAGNOSIS — Z170 Estrogen receptor positive status [ER+]: Secondary | ICD-10-CM | POA: Diagnosis not present

## 2020-07-18 LAB — CBC WITH DIFFERENTIAL (CANCER CENTER ONLY)
Abs Immature Granulocytes: 0.01 10*3/uL (ref 0.00–0.07)
Basophils Absolute: 0 10*3/uL (ref 0.0–0.1)
Basophils Relative: 1 %
Eosinophils Absolute: 0.1 10*3/uL (ref 0.0–0.5)
Eosinophils Relative: 2 %
HCT: 41 % (ref 36.0–46.0)
Hemoglobin: 13.4 g/dL (ref 12.0–15.0)
Immature Granulocytes: 0 %
Lymphocytes Relative: 30 %
Lymphs Abs: 1.3 10*3/uL (ref 0.7–4.0)
MCH: 30.5 pg (ref 26.0–34.0)
MCHC: 32.7 g/dL (ref 30.0–36.0)
MCV: 93.2 fL (ref 80.0–100.0)
Monocytes Absolute: 0.3 10*3/uL (ref 0.1–1.0)
Monocytes Relative: 6 %
Neutro Abs: 2.8 10*3/uL (ref 1.7–7.7)
Neutrophils Relative %: 61 %
Platelet Count: 235 10*3/uL (ref 150–400)
RBC: 4.4 MIL/uL (ref 3.87–5.11)
RDW: 13.7 % (ref 11.5–15.5)
WBC Count: 4.5 10*3/uL (ref 4.0–10.5)
nRBC: 0 % (ref 0.0–0.2)

## 2020-07-18 LAB — CMP (CANCER CENTER ONLY)
ALT: 12 U/L (ref 0–44)
AST: 16 U/L (ref 15–41)
Albumin: 4.8 g/dL (ref 3.5–5.0)
Alkaline Phosphatase: 115 U/L (ref 38–126)
Anion gap: 9 (ref 5–15)
BUN: 9 mg/dL (ref 6–20)
CO2: 31 mmol/L (ref 22–32)
Calcium: 10.6 mg/dL — ABNORMAL HIGH (ref 8.9–10.3)
Chloride: 98 mmol/L (ref 98–111)
Creatinine: 0.79 mg/dL (ref 0.44–1.00)
GFR, Estimated: >60 mL/min (ref >60)
Glucose, Bld: 90 mg/dL (ref 70–99)
Potassium: 4.5 mmol/L (ref 3.5–5.1)
Sodium: 138 mmol/L (ref 135–145)
Total Bilirubin: 0.8 mg/dL (ref 0.3–1.2)
Total Protein: 7.7 g/dL (ref 6.5–8.1)

## 2020-07-18 LAB — LACTATE DEHYDROGENASE: LDH: 175 U/L (ref 98–192)

## 2020-07-18 NOTE — Telephone Encounter (Signed)
Appts made and printed for pt per 07/18/20 los   Christina Joseph 

## 2020-07-18 NOTE — Progress Notes (Signed)
Hematology and Oncology Follow Up Visit  Christina Joseph [id number] 01/03/1966 55 y.o. 07/18/2020   Principle Diagnosis:  Stage IIIA (T2N2N0) - ER+/PR+/HER2+ infiltrating ductal carcinoma of the right breast   Past Therapy: Status post lumpectomy of the right breast with 3/10 residual lymph nodes Radiotherapy to the right breast - s/p 16 Rx Adjuvant Herceptin/Perjeta- s/p cycle 13 Nerlynx 240 mg po q day -- start on 10/08/2017 -- completed on 09/23/2018   Current Therapy:        Femara 2.5 mg po q day - started 01/22/2017    Interim History:  Christina Joseph is here today for follow-up.  We saw her 6 months ago.  Since then, she has been doing quite well.  She has had no problems since we last saw her.  She has had no issues with nausea or vomiting.  She has had no problems with abdominal pain.  There is been no change in bowel or bladder habits.  She is off this summer from school.  She is a Licensed conveyancer.  She is happy to be at home.  There is been no issues with fever.  She has had no COVID.  Overall, I would say her performance status is ECOG 1.     Medications:  Allergies as of 07/18/2020   No Known Allergies      Medication List        Accurate as of July 18, 2020 11:15 AM. If you have any questions, ask your nurse or doctor.          letrozole 2.5 MG tablet Commonly known as: [place] 1 TABLET(2.5 MG) BY MOUTH DAILY   valACYclovir 1000 MG tablet Commonly known as: VALTREX TAKE 1 TABLET(1000 MG) BY MOUTH DAILY AS NEEDED        Allergies: No Known Allergies  Past Medical History, Surgical history, Social history, and Family History were reviewed and updated.  Review of Systems: Review of Systems  Constitutional: Negative.   HENT: Negative.    Eyes: Negative.   Respiratory: Negative.    Cardiovascular: Negative.   Gastrointestinal: Negative.   Genitourinary: Negative.   Musculoskeletal: Negative.   Skin: Negative.   Neurological: Negative.    Endo/Heme/Allergies: Negative.   Psychiatric/Behavioral: Negative.     Physical Exam:  weight is 145 lb 12.8 oz (66.1 kg). Her oral temperature is 99.1 F (37.3 C). Her blood pressure is 128/85 and her pulse is 76. Her respiration is 17 and oxygen saturation is 100%.   Wt Readings from Last 3 Encounters:  07/18/20 145 lb 12.8 oz (66.1 kg)  01/18/20 140 lb (63.5 kg)  05/14/19 141 lb 1.3 oz (64 kg)    Physical Exam Vitals reviewed.  Constitutional:      Comments: Her breast exam shows left breast with no masses, edema or erythema.  She has no left axillary adenopathy.  Right breast shows the well-healed lumpectomy scar at about the 2 o'clock position.  There is no firmness.  There is no nipple discharge.  She has the right axillary lymphadenectomy scar that is well-healed.  There is no lymphadenopathy in the right axilla.  HENT:     Head: Normocephalic and atraumatic.  Eyes:     Pupils: Pupils are equal, round, and reactive to light.  Cardiovascular:     Rate and Rhythm: Normal rate and regular rhythm.     Heart sounds: Normal heart sounds.  Pulmonary:     Effort: Pulmonary effort is normal.     Breath  sounds: Normal breath sounds.  Abdominal:     General: Bowel sounds are normal.     Palpations: Abdomen is soft.  Musculoskeletal:        General: No tenderness or deformity. Normal range of motion.     Cervical back: Normal range of motion.  Lymphadenopathy:     Cervical: No cervical adenopathy.  Skin:    General: Skin is warm and dry.     Findings: No erythema or rash.  Neurological:     Mental Status: She is alert and oriented to person, place, and time.  Psychiatric:        Behavior: Behavior normal.        Thought Content: Thought content normal.        Judgment: Judgment normal.     Lab Results  Component Value Date   WBC 4.5 07/18/2020   HGB 13.4 07/18/2020   HCT 41.0 07/18/2020   MCV 93.2 07/18/2020   PLT 235 07/18/2020   No results found for: FERRITIN,  IRON, TIBC, UIBC, IRONPCTSAT Lab Results  Component Value Date   RBC 4.40 07/18/2020   No results found for: KPAFRELGTCHN, LAMBDASER, KAPLAMBRATIO No results found for: Kandis Cocking, IGMSERUM No results found for: Odetta Pink, SPEI   Chemistry      Component Value Date/Time   NA 138 07/18/2020 0927   NA 144 01/09/2017 0927   K 4.5 07/18/2020 0927   K 3.4 01/09/2017 0927   CL 98 07/18/2020 0927   CL 105 01/09/2017 0927   CO2 31 07/18/2020 0927   CO2 29 01/09/2017 0927   BUN 9 07/18/2020 0927   BUN 7 01/09/2017 0927   CREATININE 0.79 07/18/2020 0927   CREATININE 0.9 01/09/2017 0927      Component Value Date/Time   CALCIUM 10.6 (H) 07/18/2020 0927   CALCIUM 9.1 01/09/2017 0927   ALKPHOS 115 07/18/2020 0927   ALKPHOS 68 01/09/2017 0927   AST 16 07/18/2020 0927   ALT 12 07/18/2020 0927   ALT 26 01/09/2017 0927   BILITOT 0.8 07/18/2020 0927       Impression and Plan: Christina Joseph is a very pleasant 55 yo postmenopausal caucasian female with stage IIIa invasive ductal carcinoma of the right breast with lumpectomy.   She looks wonderful.  I do not see any evidence of recurrence of her breast cancer.  I think we can now get her back after the holidays.  I know that she will be busy with school when he starts back up.  I do not want interfere with the time that she has with her family over the holidays.  Hopefully, we will see her back, she will let us know that she will be a grandmother.     Christina Napoleon, MD 6/27/202211:15 AM

## 2021-01-05 ENCOUNTER — Other Ambulatory Visit: Payer: BC Managed Care – PPO

## 2021-01-05 ENCOUNTER — Ambulatory Visit: Payer: BC Managed Care – PPO | Admitting: Hematology & Oncology

## 2021-02-07 ENCOUNTER — Other Ambulatory Visit: Payer: Self-pay | Admitting: Hematology & Oncology

## 2021-02-07 ENCOUNTER — Other Ambulatory Visit: Payer: Self-pay

## 2021-02-07 DIAGNOSIS — Z170 Estrogen receptor positive status [ER+]: Secondary | ICD-10-CM

## 2021-02-07 DIAGNOSIS — C50911 Malignant neoplasm of unspecified site of right female breast: Secondary | ICD-10-CM

## 2021-03-09 ENCOUNTER — Ambulatory Visit: Payer: BC Managed Care – PPO

## 2021-03-11 ENCOUNTER — Other Ambulatory Visit: Payer: Self-pay | Admitting: Hematology & Oncology

## 2021-03-16 ENCOUNTER — Ambulatory Visit: Payer: BC Managed Care – PPO

## 2021-03-24 ENCOUNTER — Ambulatory Visit
Admission: RE | Admit: 2021-03-24 | Discharge: 2021-03-24 | Disposition: A | Discharge status: Home / Self Care | Payer: BC Managed Care – PPO | Source: Ambulatory Visit | Attending: Hematology & Oncology | Admitting: Hematology & Oncology

## 2021-03-24 DIAGNOSIS — C50911 Malignant neoplasm of unspecified site of right female breast: Secondary | ICD-10-CM

## 2021-03-24 DIAGNOSIS — Z170 Estrogen receptor positive status [ER+]: Secondary | ICD-10-CM

## 2022-03-11 ENCOUNTER — Other Ambulatory Visit: Payer: Self-pay | Admitting: Hematology & Oncology

## 2022-03-13 ENCOUNTER — Other Ambulatory Visit: Payer: Self-pay | Admitting: Hematology & Oncology

## 2022-03-13 DIAGNOSIS — Z1231 Encounter for screening mammogram for malignant neoplasm of breast: Secondary | ICD-10-CM

## 2022-04-30 ENCOUNTER — Ambulatory Visit
Admission: RE | Admit: 2022-04-30 | Discharge: 2022-04-30 | Disposition: A | Discharge status: Home / Self Care | Payer: BC Managed Care – PPO | Source: Ambulatory Visit | Attending: Hematology & Oncology | Admitting: Hematology & Oncology

## 2022-04-30 DIAGNOSIS — Z1231 Encounter for screening mammogram for malignant neoplasm of breast: Secondary | ICD-10-CM

## 2022-05-07 ENCOUNTER — Inpatient Hospital Stay: Payer: BC Managed Care – PPO

## 2022-05-07 ENCOUNTER — Ambulatory Visit: Payer: BC Managed Care – PPO | Admitting: Hematology & Oncology

## 2023-01-21 ENCOUNTER — Encounter: Payer: Self-pay | Admitting: Hematology & Oncology

## 2023-03-16 ENCOUNTER — Other Ambulatory Visit: Payer: Self-pay | Admitting: Hematology & Oncology

## 2023-03-18 ENCOUNTER — Encounter: Payer: Self-pay | Admitting: Hematology & Oncology

## 2023-11-04 ENCOUNTER — Other Ambulatory Visit: Payer: Self-pay | Admitting: Hematology & Oncology

## 2023-11-04 DIAGNOSIS — N644 Mastodynia: Secondary | ICD-10-CM

## 2023-11-04 DIAGNOSIS — Z1231 Encounter for screening mammogram for malignant neoplasm of breast: Secondary | ICD-10-CM

## 2023-11-06 ENCOUNTER — Other Ambulatory Visit: Payer: Self-pay | Admitting: Hematology & Oncology

## 2023-11-06 DIAGNOSIS — N644 Mastodynia: Secondary | ICD-10-CM

## 2023-12-04 ENCOUNTER — Other Ambulatory Visit: Payer: Self-pay | Admitting: Hematology & Oncology
